# Patient Record
Sex: Female | Born: 1955 | Race: White | Hispanic: No | Marital: Single | Smoking: Current every day smoker
Health system: Southern US, Community
[De-identification: ages and names within clinical notes are randomized; demographics above are authoritative.]

## PROBLEM LIST (undated history)

## (undated) DIAGNOSIS — E7800 Pure hypercholesterolemia, unspecified: Secondary | ICD-10-CM

## (undated) DIAGNOSIS — K209 Esophagitis, unspecified without bleeding: Secondary | ICD-10-CM

## (undated) DIAGNOSIS — G8929 Other chronic pain: Secondary | ICD-10-CM

## (undated) DIAGNOSIS — R479 Unspecified speech disturbances: Secondary | ICD-10-CM

## (undated) DIAGNOSIS — G9389 Other specified disorders of brain: Secondary | ICD-10-CM

## (undated) DIAGNOSIS — M5430 Sciatica, unspecified side: Secondary | ICD-10-CM

## (undated) DIAGNOSIS — J449 Chronic obstructive pulmonary disease, unspecified: Secondary | ICD-10-CM

## (undated) DIAGNOSIS — F321 Major depressive disorder, single episode, moderate: Secondary | ICD-10-CM

## (undated) DIAGNOSIS — M549 Dorsalgia, unspecified: Secondary | ICD-10-CM

## (undated) DIAGNOSIS — R32 Unspecified urinary incontinence: Secondary | ICD-10-CM

## (undated) DIAGNOSIS — Z5189 Encounter for other specified aftercare: Secondary | ICD-10-CM

## (undated) DIAGNOSIS — M545 Low back pain, unspecified: Secondary | ICD-10-CM

## (undated) DIAGNOSIS — M542 Cervicalgia: Secondary | ICD-10-CM

## (undated) DIAGNOSIS — R270 Ataxia, unspecified: Secondary | ICD-10-CM

## (undated) DIAGNOSIS — I639 Cerebral infarction, unspecified: Secondary | ICD-10-CM

## (undated) DIAGNOSIS — F329 Major depressive disorder, single episode, unspecified: Secondary | ICD-10-CM

## (undated) DIAGNOSIS — F319 Bipolar disorder, unspecified: Secondary | ICD-10-CM

## (undated) DIAGNOSIS — I10 Essential (primary) hypertension: Secondary | ICD-10-CM

## (undated) DIAGNOSIS — F32A Depression, unspecified: Secondary | ICD-10-CM

## (undated) DIAGNOSIS — M5000 Cervical disc disorder with myelopathy, unspecified cervical region: Secondary | ICD-10-CM

## (undated) DIAGNOSIS — F410 Panic disorder [episodic paroxysmal anxiety] without agoraphobia: Secondary | ICD-10-CM

## (undated) DIAGNOSIS — K219 Gastro-esophageal reflux disease without esophagitis: Secondary | ICD-10-CM

## (undated) DIAGNOSIS — J45909 Unspecified asthma, uncomplicated: Secondary | ICD-10-CM

## (undated) HISTORY — PX: NECK SURGERY: SHX720

## (undated) HISTORY — PX: OOPHORECTOMY: SHX86

## (undated) HISTORY — DX: Encounter for other specified aftercare: Z51.89

## (undated) HISTORY — DX: Unspecified asthma, uncomplicated: J45.909

## (undated) HISTORY — DX: Low back pain, unspecified: M54.50

## (undated) HISTORY — PX: BACK SURGERY: SHX140

## (undated) HISTORY — DX: Bipolar disorder, unspecified: F31.9

## (undated) HISTORY — PX: SPINE SURGERY: SHX786

## (undated) HISTORY — DX: Cervical disc disorder with myelopathy, unspecified cervical region: M50.00

## (undated) HISTORY — DX: Unspecified urinary incontinence: R32

## (undated) HISTORY — DX: Ataxia, unspecified: R27.0

## (undated) HISTORY — DX: Major depressive disorder, single episode, moderate: F32.1

---
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
## 1976-08-07 HISTORY — PX: SPLENECTOMY, TOTAL: SHX788

## 1976-08-07 HISTORY — PX: NEPHRECTOMY: SHX65

## 2001-09-08 ENCOUNTER — Emergency Department (HOSPITAL_COMMUNITY): Admission: EM | Admit: 2001-09-08 | Discharge: 2001-09-08 | Payer: Self-pay | Admitting: Emergency Medicine

## 2002-03-18 ENCOUNTER — Ambulatory Visit (HOSPITAL_COMMUNITY): Admission: RE | Admit: 2002-03-18 | Discharge: 2002-03-18 | Payer: Self-pay | Admitting: Pulmonary Disease

## 2002-10-24 ENCOUNTER — Encounter: Payer: Self-pay | Admitting: Emergency Medicine

## 2002-10-24 ENCOUNTER — Emergency Department (HOSPITAL_COMMUNITY): Admission: EM | Admit: 2002-10-24 | Discharge: 2002-10-24 | Payer: Self-pay | Admitting: Emergency Medicine

## 2002-11-21 ENCOUNTER — Emergency Department (HOSPITAL_COMMUNITY): Admission: EM | Admit: 2002-11-21 | Discharge: 2002-11-21 | Payer: Self-pay | Admitting: *Deleted

## 2002-11-21 ENCOUNTER — Encounter: Payer: Self-pay | Admitting: *Deleted

## 2005-03-20 ENCOUNTER — Ambulatory Visit (HOSPITAL_COMMUNITY): Admission: RE | Admit: 2005-03-20 | Discharge: 2005-03-20 | Payer: Self-pay | Admitting: Pulmonary Disease

## 2005-06-05 ENCOUNTER — Emergency Department (HOSPITAL_COMMUNITY): Admission: EM | Admit: 2005-06-05 | Discharge: 2005-06-05 | Payer: Self-pay | Admitting: Emergency Medicine

## 2006-01-16 ENCOUNTER — Ambulatory Visit (HOSPITAL_COMMUNITY): Admission: RE | Admit: 2006-01-16 | Discharge: 2006-01-16 | Payer: Self-pay | Admitting: Pulmonary Disease

## 2006-04-25 ENCOUNTER — Encounter (HOSPITAL_COMMUNITY): Admission: RE | Admit: 2006-04-25 | Discharge: 2006-05-05 | Payer: Self-pay | Admitting: Neurosurgery

## 2006-05-09 ENCOUNTER — Encounter (HOSPITAL_COMMUNITY): Admission: RE | Admit: 2006-05-09 | Discharge: 2006-06-08 | Payer: Self-pay | Admitting: Neurosurgery

## 2006-06-13 ENCOUNTER — Encounter (HOSPITAL_COMMUNITY): Admission: RE | Admit: 2006-06-13 | Discharge: 2006-07-13 | Payer: Self-pay | Admitting: Neurosurgery

## 2006-08-27 ENCOUNTER — Encounter: Admission: RE | Admit: 2006-08-27 | Discharge: 2006-08-27 | Payer: Self-pay | Admitting: Pulmonary Disease

## 2006-12-04 ENCOUNTER — Ambulatory Visit (HOSPITAL_COMMUNITY): Admission: RE | Admit: 2006-12-04 | Discharge: 2006-12-04 | Payer: Self-pay | Admitting: Pulmonary Disease

## 2007-09-24 ENCOUNTER — Emergency Department (HOSPITAL_COMMUNITY): Admission: EM | Admit: 2007-09-24 | Discharge: 2007-09-24 | Payer: Self-pay | Admitting: Emergency Medicine

## 2007-09-27 ENCOUNTER — Ambulatory Visit (HOSPITAL_COMMUNITY): Admission: RE | Admit: 2007-09-27 | Discharge: 2007-09-27 | Payer: Self-pay | Admitting: Pulmonary Disease

## 2008-01-01 ENCOUNTER — Inpatient Hospital Stay (HOSPITAL_COMMUNITY): Admission: RE | Admit: 2008-01-01 | Discharge: 2008-01-04 | Payer: Self-pay | Admitting: Internal Medicine

## 2008-07-10 ENCOUNTER — Emergency Department (HOSPITAL_COMMUNITY): Admission: EM | Admit: 2008-07-10 | Discharge: 2008-07-10 | Payer: Self-pay | Admitting: Emergency Medicine

## 2008-08-12 ENCOUNTER — Ambulatory Visit: Payer: Self-pay | Admitting: Internal Medicine

## 2008-08-12 LAB — CONVERTED CEMR LAB
ALT: 14 units/L (ref 0–35)
AST: 19 units/L (ref 0–37)
Albumin: 4.4 g/dL (ref 3.5–5.2)
Alkaline Phosphatase: 86 units/L (ref 39–117)
Basophils Absolute: 0 10*3/uL (ref 0.0–0.1)
Basophils Relative: 0 % (ref 0–1)
Bilirubin, Direct: <0.1 mg/dL (ref 0.0–0.3)
Eosinophils Absolute: 0.1 10*3/uL (ref 0.0–0.7)
Eosinophils Relative: 1 % (ref 0–5)
HCT: 42.2 % (ref 36.0–46.0)
Hemoglobin: 14.2 g/dL (ref 12.0–15.0)
INR: 1 (ref 0.0–1.5)
Lymphocytes Relative: 48 % — ABNORMAL HIGH (ref 12–46)
Lymphs Abs: 5.3 10*3/uL — ABNORMAL HIGH (ref 0.7–4.0)
MCHC: 33.6 g/dL (ref 30.0–36.0)
MCV: 94.2 fL (ref 78.0–100.0)
Monocytes Absolute: 0.6 10*3/uL (ref 0.1–1.0)
Monocytes Relative: 5 % (ref 3–12)
Neutro Abs: 5.2 10*3/uL (ref 1.7–7.7)
Neutrophils Relative %: 46 % (ref 43–77)
Platelets: 294 10*3/uL (ref 150–400)
Prothrombin Time: 13.7 s (ref 11.6–15.2)
RBC: 4.48 M/uL (ref 3.87–5.11)
RDW: 14.4 % (ref 11.5–15.5)
Total Bilirubin: 0.3 mg/dL (ref 0.3–1.2)
Total Protein: 7.1 g/dL (ref 6.0–8.3)
WBC: 11.1 10*3/uL — ABNORMAL HIGH (ref 4.0–10.5)

## 2008-08-27 ENCOUNTER — Encounter: Payer: Self-pay | Admitting: Internal Medicine

## 2008-08-27 ENCOUNTER — Ambulatory Visit: Payer: Self-pay | Admitting: Internal Medicine

## 2008-08-27 ENCOUNTER — Ambulatory Visit (HOSPITAL_COMMUNITY): Admission: RE | Admit: 2008-08-27 | Discharge: 2008-08-27 | Payer: Self-pay | Admitting: Internal Medicine

## 2008-09-21 ENCOUNTER — Telehealth (INDEPENDENT_AMBULATORY_CARE_PROVIDER_SITE_OTHER): Payer: Self-pay

## 2008-09-29 ENCOUNTER — Telehealth (INDEPENDENT_AMBULATORY_CARE_PROVIDER_SITE_OTHER): Payer: Self-pay

## 2008-11-09 ENCOUNTER — Other Ambulatory Visit: Payer: Self-pay

## 2008-11-09 ENCOUNTER — Other Ambulatory Visit: Payer: Self-pay | Admitting: Emergency Medicine

## 2008-11-10 ENCOUNTER — Ambulatory Visit: Payer: Self-pay | Admitting: Psychiatry

## 2008-11-10 ENCOUNTER — Inpatient Hospital Stay (HOSPITAL_COMMUNITY): Admission: AD | Admit: 2008-11-10 | Discharge: 2008-11-12 | Payer: Self-pay | Admitting: Psychiatry

## 2009-01-15 ENCOUNTER — Ambulatory Visit (HOSPITAL_COMMUNITY): Admission: RE | Admit: 2009-01-15 | Discharge: 2009-01-15 | Payer: Self-pay | Admitting: Neurosurgery

## 2009-03-10 ENCOUNTER — Inpatient Hospital Stay (HOSPITAL_COMMUNITY): Admission: RE | Admit: 2009-03-10 | Discharge: 2009-03-11 | Payer: Self-pay | Admitting: Neurosurgery

## 2009-04-08 ENCOUNTER — Encounter (INDEPENDENT_AMBULATORY_CARE_PROVIDER_SITE_OTHER): Payer: Self-pay | Admitting: *Deleted

## 2009-12-01 ENCOUNTER — Ambulatory Visit (HOSPITAL_COMMUNITY): Admission: RE | Admit: 2009-12-01 | Discharge: 2009-12-01 | Payer: Self-pay | Admitting: Neurosurgery

## 2009-12-08 ENCOUNTER — Emergency Department (HOSPITAL_COMMUNITY): Admission: EM | Admit: 2009-12-08 | Discharge: 2009-12-08 | Payer: Self-pay | Admitting: Emergency Medicine

## 2010-01-12 ENCOUNTER — Encounter: Admission: RE | Admit: 2010-01-12 | Discharge: 2010-01-12 | Payer: Self-pay | Admitting: Neurosurgery

## 2010-01-20 ENCOUNTER — Emergency Department (HOSPITAL_COMMUNITY): Admission: EM | Admit: 2010-01-20 | Discharge: 2010-01-20 | Payer: Self-pay | Admitting: Emergency Medicine

## 2010-08-28 ENCOUNTER — Encounter: Payer: Self-pay | Admitting: Neurosurgery

## 2010-09-12 ENCOUNTER — Other Ambulatory Visit (HOSPITAL_COMMUNITY): Payer: Self-pay | Admitting: Neurosurgery

## 2010-09-12 DIAGNOSIS — M5416 Radiculopathy, lumbar region: Secondary | ICD-10-CM

## 2010-09-18 ENCOUNTER — Emergency Department (HOSPITAL_COMMUNITY)
Admission: EM | Admit: 2010-09-18 | Discharge: 2010-09-19 | Disposition: A | Discharge status: Home / Self Care | Payer: Medicaid Other | Attending: Emergency Medicine | Admitting: Emergency Medicine

## 2010-09-18 DIAGNOSIS — L02419 Cutaneous abscess of limb, unspecified: Secondary | ICD-10-CM | POA: Insufficient documentation

## 2010-09-18 DIAGNOSIS — L03119 Cellulitis of unspecified part of limb: Secondary | ICD-10-CM | POA: Insufficient documentation

## 2010-09-22 LAB — CULTURE, ROUTINE-ABSCESS: Culture: NO GROWTH

## 2010-09-27 ENCOUNTER — Ambulatory Visit (HOSPITAL_COMMUNITY)
Admission: RE | Admit: 2010-09-27 | Discharge: 2010-09-27 | Disposition: A | Discharge status: Home / Self Care | Payer: Medicaid Other | Source: Ambulatory Visit | Attending: Neurosurgery | Admitting: Neurosurgery

## 2010-09-27 ENCOUNTER — Other Ambulatory Visit (HOSPITAL_COMMUNITY): Payer: Self-pay

## 2010-09-27 ENCOUNTER — Other Ambulatory Visit (HOSPITAL_COMMUNITY): Payer: Self-pay | Admitting: Neurosurgery

## 2010-09-27 DIAGNOSIS — M5416 Radiculopathy, lumbar region: Secondary | ICD-10-CM

## 2010-09-27 DIAGNOSIS — M79609 Pain in unspecified limb: Secondary | ICD-10-CM | POA: Insufficient documentation

## 2010-09-27 DIAGNOSIS — M549 Dorsalgia, unspecified: Secondary | ICD-10-CM | POA: Insufficient documentation

## 2010-09-27 DIAGNOSIS — Z981 Arthrodesis status: Secondary | ICD-10-CM | POA: Insufficient documentation

## 2010-09-27 MED ORDER — IOHEXOL 180 MG/ML  SOLN: 20.0000 mL | Freq: Once | INTRAMUSCULAR | Status: DC | PRN

## 2010-10-23 LAB — CBC
Hemoglobin: 14.6 g/dL (ref 12.0–15.0)
RBC: 4.44 MIL/uL (ref 3.87–5.11)

## 2010-10-23 LAB — URINALYSIS, ROUTINE W REFLEX MICROSCOPIC
Bilirubin Urine: NEGATIVE
Specific Gravity, Urine: 1.025 (ref 1.005–1.030)
Urobilinogen, UA: 0.2 mg/dL (ref 0.0–1.0)
pH: 6 (ref 5.0–8.0)

## 2010-10-23 LAB — DIFFERENTIAL
Basophils Absolute: 0 10*3/uL (ref 0.0–0.1)
Lymphocytes Relative: 29 % (ref 12–46)
Monocytes Absolute: 0.8 10*3/uL (ref 0.1–1.0)
Monocytes Relative: 6 % (ref 3–12)
Neutro Abs: 8.4 10*3/uL — ABNORMAL HIGH (ref 1.7–7.7)

## 2010-10-23 LAB — BASIC METABOLIC PANEL
Calcium: 10.4 mg/dL (ref 8.4–10.5)
GFR calc Af Amer: >60 mL/min (ref >60)
GFR calc non Af Amer: >60 mL/min (ref >60)
Sodium: 141 mEq/L (ref 135–145)

## 2010-10-23 LAB — POCT CARDIAC MARKERS
CKMB, poc: 1.2 ng/mL (ref 1.0–8.0)
Troponin i, poc: <0.05 ng/mL (ref 0.00–0.09)

## 2010-10-23 LAB — URINE MICROSCOPIC-ADD ON

## 2010-10-25 LAB — CBC
HCT: 39.7 % (ref 36.0–46.0)
Platelets: 285 10*3/uL (ref 150–400)
WBC: 13.7 10*3/uL — ABNORMAL HIGH (ref 4.0–10.5)

## 2010-10-25 LAB — URINE MICROSCOPIC-ADD ON

## 2010-10-25 LAB — PROTIME-INR
INR: 0.91 (ref 0.00–1.49)
Prothrombin Time: 12.5 seconds (ref 11.6–15.2)

## 2010-10-25 LAB — URINALYSIS, ROUTINE W REFLEX MICROSCOPIC
Glucose, UA: NEGATIVE mg/dL
Ketones, ur: NEGATIVE mg/dL
Nitrite: NEGATIVE
Protein, ur: 30 mg/dL — AB
Urobilinogen, UA: 0.2 mg/dL (ref 0.0–1.0)

## 2010-10-25 LAB — BASIC METABOLIC PANEL
BUN: 20 mg/dL (ref 6–23)
Calcium: 9.5 mg/dL (ref 8.4–10.5)
GFR calc non Af Amer: >60 mL/min (ref >60)
Potassium: 4 mEq/L (ref 3.5–5.1)
Sodium: 140 mEq/L (ref 135–145)

## 2010-10-25 LAB — APTT: aPTT: 29 seconds (ref 24–37)

## 2010-10-25 LAB — DIFFERENTIAL
Eosinophils Relative: 1 % (ref 0–5)
Lymphocytes Relative: 33 % (ref 12–46)
Lymphs Abs: 4.5 10*3/uL — ABNORMAL HIGH (ref 0.7–4.0)
Neutro Abs: 8.3 10*3/uL — ABNORMAL HIGH (ref 1.7–7.7)

## 2010-11-13 LAB — CBC
MCHC: 34.9 g/dL (ref 30.0–36.0)
Platelets: 274 10*3/uL (ref 150–400)
RDW: 13.3 % (ref 11.5–15.5)

## 2010-11-13 LAB — BASIC METABOLIC PANEL
BUN: 13 mg/dL (ref 6–23)
CO2: 24 mEq/L (ref 19–32)
Calcium: 9.9 mg/dL (ref 8.4–10.5)
Creatinine, Ser: 0.69 mg/dL (ref 0.4–1.2)
GFR calc non Af Amer: >60 mL/min (ref >60)
Glucose, Bld: 85 mg/dL (ref 70–99)

## 2010-11-16 LAB — COMPREHENSIVE METABOLIC PANEL
AST: 22 U/L (ref 0–37)
Albumin: 4.1 g/dL (ref 3.5–5.2)
Alkaline Phosphatase: 72 U/L (ref 39–117)
BUN: 11 mg/dL (ref 6–23)
CO2: 25 mEq/L (ref 19–32)
Chloride: 106 mEq/L (ref 96–112)
Creatinine, Ser: 0.73 mg/dL (ref 0.4–1.2)
GFR calc Af Amer: >60 mL/min (ref >60)
GFR calc non Af Amer: >60 mL/min (ref >60)
Potassium: 4.8 mEq/L (ref 3.5–5.1)
Total Bilirubin: 0.7 mg/dL (ref 0.3–1.2)

## 2010-11-16 LAB — CBC
MCHC: 34.5 g/dL (ref 30.0–36.0)
MCV: 97.2 fL (ref 78.0–100.0)
Platelets: 265 10*3/uL (ref 150–400)
RBC: 4.05 MIL/uL (ref 3.87–5.11)

## 2010-11-16 LAB — RAPID URINE DRUG SCREEN, HOSP PERFORMED
Cocaine: NOT DETECTED
Opiates: POSITIVE — AB
Tetrahydrocannabinol: NOT DETECTED

## 2010-11-16 LAB — DIFFERENTIAL
Basophils Absolute: 0 10*3/uL (ref 0.0–0.1)
Basophils Relative: 0 % (ref 0–1)
Eosinophils Absolute: 0 10*3/uL (ref 0.0–0.7)
Neutro Abs: 5.4 10*3/uL (ref 1.7–7.7)
Neutrophils Relative %: 48 % (ref 43–77)

## 2010-11-21 LAB — BASIC METABOLIC PANEL
Calcium: 10.1 mg/dL (ref 8.4–10.5)
Creatinine, Ser: 0.77 mg/dL (ref 0.4–1.2)
GFR calc Af Amer: >60 mL/min (ref >60)
GFR calc non Af Amer: >60 mL/min (ref >60)
Sodium: 143 mEq/L (ref 135–145)

## 2010-11-21 LAB — HEMOGLOBIN AND HEMATOCRIT, BLOOD
HCT: 42.7 % (ref 36.0–46.0)
Hemoglobin: 14.4 g/dL (ref 12.0–15.0)

## 2010-11-21 LAB — HCG, QUANTITATIVE, PREGNANCY
hCG, Beta Chain, Quant, S: 4 m[IU]/mL (ref <5)
hCG, Beta Chain, Quant, S: 5 m[IU]/mL — ABNORMAL HIGH (ref <5)

## 2010-12-20 NOTE — Op Note (Signed)
Lindsey Howard, Lindsey Howard NO.:  [id number]   MEDICAL RECORD NO.:  [id number]          PATIENT TYPE:  INP   LOCATION:  3009                         FACILITY:  [facility]   PHYSICIAN:  Cristi Loron, M.D.DATE OF BIRTH:  1955/10/01   DATE OF PROCEDURE:  01/01/2008  DATE OF DISCHARGE:                               OPERATIVE REPORT   BRIEF HISTORY:  The patient is a 55 year old white female who has  suffered from severe back pain.  She failed medical management, worked  up with a lumbar MRI and lumbar x-rays which demonstrated that the  patient had a mobile spondylolisthesis at L4-5 with severe facet  arthropathy and degenerative changes.  I discussed the situation with  the patient and her husband and I discussed the various treatment  options with him.  The patient is aware of the risks, benefits, and  alternatives of surgery and decided to proceed with a L4-5 decompression  instrumentation and fusion.   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   PROCEDURE:  Bilateral L4 laminotomies to decompress bilateral L4-L5  nerve roots; L4-5 transforaminal lumbar interbody fusion with local  autograft bone and active fused/VITOSS bone graft extender; insertion of  L4-5 interbody prosthesis (Capstone PEEK interbody prosthesis);  posterior nonsegmental instrumentation L4-5 with legacy titanium pedicle  screws and rods; posterior arthrodesis L4-5 with local morselized  autograft bone, VITOSS and active fused graft extender.   SURGEON:  Cristi Loron, M.D.   ASSISTANT:  Clydene Fake, M.D.   ANESTHESIA:  General endotracheal.   ESTIMATED BLOOD LOSS:  300 mL.   SPECIMENS:  None.   DRAINS:  None.   COMPLICATIONS:  None.   DESCRIPTION OF PROCEDURE:  The patient was brought to  the operating room  by anesthesia team.  General endotracheal anesthesia was induced.  The  patient was turned to the prone position on the Wilson frame.  Her  lumbosacral region was then prepared with Betadine scrub and Betadine  solution.  Sterile drapes were applied.  I then injected the area to be  incised with Marcaine with epinephrine solution.  I used a scalpel to  make a linear midline incision over the L4-5 interspace.  I used  electrocautery to perform a bilateral subperiosteal dissection exposing  the spinous process lamina of L3, L4, and L5.  We obtained  intraoperative radiograph to confirm our location and then inserted  Versa-Trac retractor for exposure.   We began the decompression by performing a bilateral L4 laminotomy.  We  widened the laminotomy with the Kerrison punch and we removed the excess  ligamentum flavum as well as the medial aspect of the bilateral L4-5  facet joints.  Of note, this decompression was in excess of what was  required to do the lumbar interbody fusion secondary to the severe facet  arthropathy at this level.  We performed foraminotomy about the  bilateral L4 and L5 nerve roots completing the decompression of the  nerve roots and the thecal sac  at L4-5.   Having completed the decompression we now turned attention to  arthrodesis.  We used a high-speed drill to remove the inferior facet on  the right at L4-5 to provide a wide exposure to the lateral aspect of  the intervertebral disk.  We then incised the L4-5 intervertebral disk  and performed a partial intervertebral diskectomy with the pituitary  forceps and the curettes.  We prepared the vertebral endplates for the  lumbar interbody fusion by removing soft tissue using the curettes and  the pituitary forceps.  We then used a trial spacer and determined to  use a 12 x 26 mm Capstone PEEK interbody prosthesis.  We inserted the  prosthesis into the interspace of course after retracting neural   structures out of harm's way.  We then used the bone tap to turn the  prosthesis laterally, i.e. performed a transforaminal lumbar interbody  fusion.  I should note that we filled both anterior and posterior to the  prosthesis with a combination of VITOSS bone graft extender, local  autograft bone, and active fused bone graft extender.  This completed  the transforaminal lumbar interbody fusion and insertion of the  prosthesis.   We now turned our attention to instrumentation.  Under fluoroscopic  guidance, we cannulated the bilateral L4 and L5 pedicles with the bone  probe.  We tapped the pedicles with a 5.5-mm tap and then inserted 6.5 x  50-mm pedicle screws bilaterally into the L4 and L5 pedicles.  We did  this under fluoroscopic guidance.  I should note that prior to placement  of the pedicle screws, we probed inside the tapped pedicle to rule out  cortical breeches and after placement of pedicle screws, we palpated  along the medial aspect of the pedicles and around the cortical breeches  and there was no nerve root injury.  We then connected unilateral  pedicle screws with a lordotic rod.  We compressed the construct and  secured the rod in place using the capsule which we tightened  appropriately.  This completed the instrumentation.   We now turned our attention to posterolateral arthrodesis.  We used the  high-speed drill to decorticate the remainder of the left L4-5 facet  joint and a left L4 and L5 transverse processes.  We then laid a  combination of local autograft bone.  We obtained during the  decompression active fuse and VITOSS bone graft extender over these  decorticated posterolateral structures.  This completed the  posterolateral arthrodesis.  We then obtained hemostasis using bipolar  electrocautery.  We irrigated the wound out with bacitracin solution.  We then removed the retractor.  We then reapproximated the patient's  thoracolumbar fascia with interrupted #1  Vicryl suture, subcutaneous  tissue with interrupted 2-0 Vicryl suture and the skin with Steri-Strips  and Benzoin.  The wound was then coated with bacitracin ointment.  Sterile dressings were applied.  The drapes were removed and the patient  was subsequently returned to supine position where she was extubated by  anesthesia team and transported to the post anesthesia care unit in  stable condition.  All sponge, instrument, and needle counts were  correct at the end of this case.      Cristi Loron, M.D.  Electronically Signed     JDJ/MEDQ  D:  01/01/2008  T:  01/02/2008  Job:  [id number]
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
## 2010-12-20 NOTE — Op Note (Signed)
Lindsey Howard, Lindsey Howard NO.:  [id number]   MEDICAL RECORD NO.:  [id number]          PATIENT TYPE:  OIB   LOCATION:  3534                         FACILITY:  [facility]   PHYSICIAN:  Cristi Loron, M.D.DATE OF BIRTH:  Jul 11, 1956   DATE OF PROCEDURE:  03/10/2009  DATE OF DISCHARGE:                               OPERATIVE REPORT   BRIEF HISTORY:  This patient is a 55 year old white female who has  suffered from neck and bilateral arm pain consistent with a cervical  radiculopathy.  She had failed medical management and was worked up with  a cervical MRI, which demonstrated the patient had disk degeneration  with spondylosis, etc, at C4-5, C5-6.  I discussed the various treatment  options with the patient and husband including surgery.  She has weighed  the risks, benefits and alternatives of surgery and decided to proceed  with a C4-5 and C5-6 anterior cervical diskectomy and fusion plating.   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   PROCEDURES:  C4-5 and C5-6 extensive anterior cervical  diskectomy/decompression; C4-5 and C5-6 anterior interbody arthrodesis  with local morselized autograft bone and Vitoss bone graft extender;  insertion of C4-5 and C5-6 interbody prosthesis (Novel PEEK interbody  prosthesis); and C4 through C6 anterior cervical plating with Codman  Slim-Loc titanium plate and screws.   SURGEON:  Cristi Loron, MD   ASSISTANT:  Coletta Memos, MD   ANESTHESIA:  General endotracheal.   ESTIMATED BLOOD LOSS:  100 mL.   SPECIMENS:  None.   DRAINS:  None.   COMPLICATIONS:  None.   DESCRIPTION OF PROCEDURE:  The patient was brought to the operating room  by the anesthesia team.  General endotracheal anesthesia was induced.  This  patient remained in supine position.  A roll was placed under her  shoulders and placed her neck in slight extension.  Her anterior  cervical region was then prepared with Betadine scrub and Betadine  solution.  Sterile drapes were applied.  I then injected the area to be  incised with Marcaine with epinephrine solution and a scalpel to make a  transverse incision in the patient's left anterior neck.  I used the  Metzenbaum scissors to divide the platysma muscle and then to dissect  medial to sternocleidomastoid muscle, jugular vein, and carotid artery.  We carefully dissected down towards the anterior cervical spine  identifying the esophagus and retracted it medially.  We then used the  Kittner swabs to clear the soft tissue from the anterior cervical spine  and then inserted a bent spinal needle into the upper exposed  intervertebral disk space.  We obtained intraoperative radiograph to  confirm our location.   We then used electrocautery to detach the medial border of the longus  colli muscle bilaterally from the C4-5 and C5-6 intervertebral disk  space.  We inserted the Caspar self-retaining retractor underneath the  longus colli muscle bilaterally to provide exposure.  We began the  decompression by incising the C4-5 intervertebral disk with a #15 blade  scalpel.  We performed a partial intervertebral diskectomy using the  pituitary forceps and the Karlin curettes.  We inserted distraction  screws at C4 and C5 and distracted the interspace and then used the high-  speed drill to decorticate the vertebral endplates at C4-5 to drill away  the remainder of C4-5 intervertebral disk and to drill away some  posterior spondylosis, and to thin out the posterior longitudinal  ligament.  We incised the ligament with arachnoid knife and then removed  it with Kerrison punch undercutting the vertebral endplates at C4-5  decompressing the thecal sac.  We then performed foraminotomies about  the  bilateral C5 nerve roots completing the decompression at C4-5.   We then repeated this procedure in an analogous fashion at C5-6  decompressing the thecal sac and the bilateral C6 nerve roots.  This  completed the decompression.   We now turned our attention to the arthrodesis.  We used the trial  spacers and determined to use a 5-mm medium interbody prosthesis at both  the levels.  We prefilled the prostheses with a combination of local  morselized autograft bone that we obtained during the decompression as  well as Actifuse bone graft extender.  We then inserted the prostheses  and distracted interspaces at C4-5 and C5-6 and then removed distraction  screws.  There was good snug fit of the prostheses at both levels.  This  completed the interbody fusion.   We now turned attention to the anterior spinal instrumentation.  We used  a high-speed drill to remove some ventral spondylosis from the vertebral  endplates at C4-5 and C5-6 so that the plate will lay down flat.  We  selected appropriate length Codman Slim-Loc anterior cervical plate.  We  laid it along the anterior aspect of the vertebral bodies of the C4, C6.  We then drilled two 12-mm holes at C4, 5, and 6.  We then secured the  plate to the vertebral bodies by placing two 12-mm self-tapping screws  at C4, two at C5, and two at C6.  We got good bony purchase.  We then  obtained intraoperative radiograph, which demonstrated good position of  plate, screws, and interbody prostheses.  We, therefore, secured the  screws to the plate by locking each cam.  This completed the  instrumentation.   We then obtained hemostasis using bipolar electrocautery.  We irrigated  the wound out with bacitracin solution.  We then removed the retractors.  We inspected the esophagus for any damage; there was none apparent.  We  then reapproximated the patient's platysma muscle with interrupted 3-0  Vicryl suture.  The subcutaneous tissue with  interrupted 3-0 Vicryl  suture and the skin with Dermabond, as the patient had an allergy to  ADHESIVES.  The drapes were then removed, and the patient was  subsequently extubated by the anesthesia team and transported to the  postanesthesia care unit in stable condition.  All sponge, instrument,  and needle counts were correct at the end of this case.      Cristi Loron, M.D.  Electronically Signed     JDJ/MEDQ  D:  03/10/2009  T:  03/11/2009  Job:  [id number]

## 2010-12-20 NOTE — Op Note (Signed)
[name], WEIGHT               ACCOUNT NO.:  [id number]   MEDICAL RECORD NO.:  [id number]          PATIENT TYPE:  AMB   LOCATION:  DAY                           FACILITY:  [facility]   PHYSICIAN:  R. Roetta Sessions, M.D. DATE OF BIRTH:  12-26-1955   DATE OF PROCEDURE:  08/27/2008  DATE OF DISCHARGE:                               OPERATIVE REPORT   PROCEDURE:  EGD with Lindsey Howard dilation followed by gastric mucosal biopsy  followed by screening iliac colonoscopy.   INDICATIONS FOR PROCEDURE:  [name] with intermittent  hematemesis, some atypical chest pain, progressive esophageal dysphagia  to solids for the past 9 months, longstanding smoking history.  She also  describes melena from time to time.  She has never had her upper GI  tract evaluated nor she had screening colonoscopy.  Because of her  polypharmacy, we will enlist Dr. Jayme Cloud for anesthesia as EGD with  possible esophageal dilation and colonoscopy now being performed.  Risks, benefits, alternatives, and limitations have been reviewed,  questions were answered.  Please see documentation in the medical  record.   PROCEDURE NOTE:  O2 saturation, blood pressure, pulse, respirations were  monitored throughout the entire procedure.   Cetacaine spray for topical pharyngeal anesthesia.  Propofol anesthesia  per Anesthesia.   INSTRUMENT:  Pentax video chip system.   FINDINGS:  EGD examination:  Tubular esophagus revealed patent tubular  esophagus.  Tiny circumferential distal esophageal erosions and no  Barrett esophagus.  No evidence of tumor.  EG junction easily traversed.  Stomach:  Gastric cavity was emptied and insufflated well with air.  Thorough examination of the gastric mucosa including retroflexed view of  the proximal stomach, esophagogastric junction demonstrated diffuse  mottling of the gastric mucosa and some submucosal petechiae, but there  was no evidence of ulcer or infiltrating process.  There was no  hiatal  hernia.  The pylorus was somewhat patulous, very easily traversed.  Examination of the bulb and second portion revealed no abnormalities.  Therapeutic/diagnostic maneuvers performed.  The Scope was withdrawn.  A  54-French Maloney dilator was passed with full insertion with ease and  that revealed no apparent complication related to the passage of the  dilator.  Subsequently, biopsies of the antrum and body were taken for  histologic study.  The patient tolerated the procedure well.   Colonoscopy:  Digital rectal exam revealed no abnormalities.   Endoscopic findings:  Prep was good except for the ascending segment  that was relatively poor.   Colon:  Colonic mucosa was surveyed from the rectosigmoid junction  through the left transverse, right colon to the appendiceal orifice,  ileocecal valve, and cecum.  These structures were well seen and  photographed for the record.  Terminal ileum was intubated to 10 cm.  From this level, scope was slowly and cautiously withdrawn.  All  previously mentioned mucosal surfaces were again seen.  There was  adherent stool throughout the right colon, which had to be washed to  gain adequate visualization.  The colonic mucosa was opened, well seen,  and appeared normal.  Terminal ileal mucosa  also appeared normal.  Scope  was pulled down to the rectum with the examination of rectal mucosa  including retroflex view of the anal verge demonstrated no  abnormalities.  The patient tolerated the procedure well.  (Cecal  withdrawal time was 6 minutes 30 seconds).   IMPRESSION:  1. Circumferential distal esophageal erosions consistent with mild      erosive reflux esophagitis, otherwise normal esophageal mucosa      status post pass of Maloney dilator.  2. Mottling and fine petechial hemorrhage of the gastric mucosa status      post biopsy.  3. Patent, patulous pylorus.  Normal D1 and D2.   RECOMMENDATIONS:  1. Begin Zegerid 40 mg orally daily.   Prescription given.  Antireflux      measures literature provided to Lindsey Howard.  2. A repeat colonoscopy for screening purposes in 10 years.  3. Follow up on path.  4. Further recommendations to follow.      Jonathon Bellows, M.D.  Electronically Signed     RMR/MEDQ  D:  08/27/2008  T:  08/27/2008  Job:  [id number]   cc:   Ramon Dredge L. Juanetta Gosling, M.D.  Fax: [phone]
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
## 2011-05-03 LAB — BASIC METABOLIC PANEL
BUN: 11
BUN: 8
BUN: 8
CO2: 27
Calcium: 9.9
Chloride: 102
GFR calc non Af Amer: >60
GFR calc non Af Amer: >60
Glucose, Bld: 136 — ABNORMAL HIGH
Glucose, Bld: 82
Potassium: 4.3
Potassium: 4.4
Sodium: 142

## 2011-05-03 LAB — DIFFERENTIAL
Basophils Absolute: 0
Basophils Absolute: 0.1
Basophils Relative: 0
Basophils Relative: 0
Eosinophils Absolute: 0
Eosinophils Absolute: 0
Lymphs Abs: 2
Monocytes Absolute: 1.7 — ABNORMAL HIGH
Monocytes Relative: 9
Neutro Abs: 15.2 — ABNORMAL HIGH
Neutro Abs: 23.4 — ABNORMAL HIGH
Neutrophils Relative %: 79 — ABNORMAL HIGH

## 2011-05-03 LAB — URINALYSIS, ROUTINE W REFLEX MICROSCOPIC
Bilirubin Urine: NEGATIVE
Bilirubin Urine: NEGATIVE
Ketones, ur: NEGATIVE
Nitrite: NEGATIVE
Protein, ur: 30 — AB
Specific Gravity, Urine: 1.015
Urobilinogen, UA: 0.2
pH: 7.5

## 2011-05-03 LAB — CBC
HCT: 34.2 — ABNORMAL LOW
HCT: 40.3
Hemoglobin: 10.1 — ABNORMAL LOW
Hemoglobin: 11.9 — ABNORMAL LOW
Hemoglobin: 14.1
MCHC: 34.6
MCV: 93.8
MCV: 94
Platelets: 279
Platelets: 304
Platelets: ADEQUATE
RDW: 13.5
RDW: 13.6
RDW: 13.7
WBC: 12.9 — ABNORMAL HIGH

## 2011-05-03 LAB — URINE MICROSCOPIC-ADD ON

## 2011-05-03 LAB — TYPE AND SCREEN: Antibody Screen: NEGATIVE

## 2011-05-11 LAB — CBC
HCT: 40.8 % (ref 36.0–46.0)
Hemoglobin: 13.8 g/dL (ref 12.0–15.0)
MCHC: 33.9 g/dL (ref 30.0–36.0)
MCV: 93.9 fL (ref 78.0–100.0)
Platelets: 324 10*3/uL (ref 150–400)
RDW: 14.4 % (ref 11.5–15.5)

## 2011-05-11 LAB — URINALYSIS, ROUTINE W REFLEX MICROSCOPIC
Bilirubin Urine: NEGATIVE
Nitrite: NEGATIVE
Protein, ur: 100 mg/dL — AB
Specific Gravity, Urine: 1.02 (ref 1.005–1.030)
Urobilinogen, UA: 0.2 mg/dL (ref 0.0–1.0)

## 2011-05-11 LAB — URINE MICROSCOPIC-ADD ON

## 2011-05-11 LAB — DIFFERENTIAL
Basophils Relative: 1 % (ref 0–1)
Lymphocytes Relative: 25 % (ref 12–46)
Monocytes Absolute: 0.6 10*3/uL (ref 0.1–1.0)
Monocytes Relative: 5 % (ref 3–12)
Neutro Abs: 8.5 10*3/uL — ABNORMAL HIGH (ref 1.7–7.7)
Neutrophils Relative %: 69 % (ref 43–77)

## 2011-05-11 LAB — COMPREHENSIVE METABOLIC PANEL
Albumin: 3.4 g/dL — ABNORMAL LOW (ref 3.5–5.2)
Alkaline Phosphatase: 83 U/L (ref 39–117)
BUN: 9 mg/dL (ref 6–23)
Calcium: 9.7 mg/dL (ref 8.4–10.5)
Creatinine, Ser: 0.64 mg/dL (ref 0.4–1.2)
Glucose, Bld: 154 mg/dL — ABNORMAL HIGH (ref 70–99)
Total Protein: 6.5 g/dL (ref 6.0–8.3)

## 2011-08-10 ENCOUNTER — Emergency Department (HOSPITAL_COMMUNITY)
Admission: EM | Admit: 2011-08-10 | Discharge: 2011-08-11 | Disposition: A | Discharge status: Home / Self Care | Payer: Medicaid Other | Attending: Emergency Medicine | Admitting: Emergency Medicine

## 2011-08-10 ENCOUNTER — Emergency Department (HOSPITAL_COMMUNITY): Payer: Medicaid Other

## 2011-08-10 ENCOUNTER — Other Ambulatory Visit: Payer: Self-pay

## 2011-08-10 DIAGNOSIS — F17200 Nicotine dependence, unspecified, uncomplicated: Secondary | ICD-10-CM | POA: Insufficient documentation

## 2011-08-10 DIAGNOSIS — R079 Chest pain, unspecified: Secondary | ICD-10-CM | POA: Insufficient documentation

## 2011-08-10 DIAGNOSIS — F411 Generalized anxiety disorder: Secondary | ICD-10-CM | POA: Insufficient documentation

## 2011-08-10 DIAGNOSIS — R05 Cough: Secondary | ICD-10-CM | POA: Insufficient documentation

## 2011-08-10 DIAGNOSIS — R059 Cough, unspecified: Secondary | ICD-10-CM | POA: Insufficient documentation

## 2011-08-10 DIAGNOSIS — F410 Panic disorder [episodic paroxysmal anxiety] without agoraphobia: Secondary | ICD-10-CM | POA: Insufficient documentation

## 2011-08-10 DIAGNOSIS — R0602 Shortness of breath: Secondary | ICD-10-CM | POA: Insufficient documentation

## 2011-08-10 HISTORY — DX: Major depressive disorder, single episode, unspecified: F32.9

## 2011-08-10 HISTORY — DX: Depression, unspecified: F32.A

## 2011-08-10 HISTORY — DX: Sciatica, unspecified side: M54.30

## 2011-08-10 HISTORY — DX: Panic disorder (episodic paroxysmal anxiety): F41.0

## 2011-08-10 LAB — CBC
Platelets: 304 10*3/uL (ref 150–400)
RBC: 4.34 MIL/uL (ref 3.87–5.11)
WBC: 14.8 10*3/uL — ABNORMAL HIGH (ref 4.0–10.5)

## 2011-08-10 LAB — DIFFERENTIAL
Basophils Absolute: 0 10*3/uL (ref 0.0–0.1)
Eosinophils Absolute: 0.1 10*3/uL (ref 0.0–0.7)
Lymphocytes Relative: 26 % (ref 12–46)
Lymphs Abs: 3.9 10*3/uL (ref 0.7–4.0)
Neutrophils Relative %: 63 % (ref 43–77)

## 2011-08-10 LAB — POCT I-STAT TROPONIN I: Troponin i, poc: 0 ng/mL (ref 0.00–0.08)

## 2011-08-10 LAB — BASIC METABOLIC PANEL
CO2: 28 mEq/L (ref 19–32)
GFR calc non Af Amer: >90 mL/min (ref >90)
Glucose, Bld: 94 mg/dL (ref 70–99)
Potassium: 3.7 mEq/L (ref 3.5–5.1)
Sodium: 139 mEq/L (ref 135–145)

## 2011-08-10 MED ORDER — MORPHINE SULFATE 4 MG/ML IJ SOLN
4.0000 mg | Freq: Once | INTRAMUSCULAR | Status: AC
  Administered 2011-08-10: 4 mg via INTRAVENOUS
  Filled 2011-08-10: qty 1

## 2011-08-10 NOTE — ED Provider Notes (Signed)
History     CSN: [id number]  Arrival date & time 08/10/11  1500   First MD Initiated Contact with Patient 08/10/11 1723      Chief Complaint  Patient presents with  . Chest Pain     Patient is a 56 y.o. female presenting with chest pain. The history is provided by the patient and a relative.  Chest Pain The chest pain began 12 - 24 hours ago. Episode Length: a few seconds. Chest pain occurs frequently. The chest pain is unchanged. The pain is associated with breathing, coughing and lifting. The severity of the pain is moderate. The quality of the pain is described as sharp. The pain does not radiate. Chest pain is worsened by certain positions and deep breathing. Primary symptoms include shortness of breath and cough. Pertinent negatives for primary symptoms include no fever, no fatigue, no syncope, no abdominal pain, no nausea, no vomiting and no dizziness.  Pertinent negatives for associated symptoms include no near-syncope and no numbness.   Pt woke up this AM with left sided CP Worse with breathing/palpation/movement Did fall several days ago but can not recall a chest injury Pain is sharp in nature No abd pain No focal weakness No h/o CAD/DVT/PE Pain has not been constant  Past Medical History  Diagnosis Date  . Back pain   . Sciatica   . Panic attacks   . Depression     Past Surgical History  Procedure Date  . Back surgery   . Neck surgery     No family history on file.  History  Substance Use Topics  . Smoking status: Current Everyday Smoker  . Smokeless tobacco: Not on file  . Alcohol Use: No    OB History    Grav Para Term Preterm Abortions TAB SAB Ect Mult Living                  Review of Systems  Constitutional: Negative for fever and fatigue.  Respiratory: Positive for cough and shortness of breath.   Cardiovascular: Positive for chest pain. Negative for syncope and near-syncope.  Gastrointestinal: Negative for nausea, vomiting and abdominal  pain.  Neurological: Negative for dizziness and numbness.  All other systems reviewed and are negative.    Allergies  Latex and Penicillins  Home Medications   Current Outpatient Rx  Name Route Sig Dispense Refill  . BC HEADACHE POWDER PO Oral Take 1 packet by mouth as needed. For pain     . DIAZEPAM 5 MG PO TABS Oral Take 5 mg by mouth every 8 (eight) hours as needed. For spasms     . HYDROCODONE-ACETAMINOPHEN 10-325 MG PO TABS Oral Take 1 tablet by mouth every 5 (five) hours as needed. For pain     . SERTRALINE HCL 50 MG PO TABS Oral Take 50 mg by mouth daily.        BP 112/76  Pulse 78  Temp(Src) 97.8 F (36.6 C) (Oral)  Resp 20  Ht 5\' 3"  (1.6 m)  Wt 135 lb (61.236 kg)  BMI 23.91 kg/m2  SpO2 95%  Physical Exam CONSTITUTIONAL: Well developed/well nourished HEAD AND FACE: Normocephalic/atraumatic EYES: EOMI/PERRL ENMT: Mucous membranes moist NECK: supple no meningeal signs SPINE:entire spine nontender CV: S1/S2 noted, no murmurs/rubs/gallops noted LUNGS: Lungs are clear to auscultation bilaterally, no apparent distress Chest - tender to palpation in left chest.  No bruising/crepitance.  Worse with movement of her torso ABDOMEN: soft, nontender, no rebound or guarding GU:no cva tenderness NEURO:  Pt is awake/alert, moves all extremitiesx4 EXTREMITIES: pulses normal, full ROM SKIN: warm, color normal PSYCH: no abnormalities of mood noted   ED Course  Procedures   Labs Reviewed  CBC - Abnormal; Notable for the following:    WBC 14.8 (*)    All other components within normal limits  DIFFERENTIAL - Abnormal; Notable for the following:    Neutro Abs 9.3 (*)    Monocytes Absolute 1.5 (*)    All other components within normal limits  POCT I-STAT TROPONIN I  I-STAT TROPONIN I  BASIC METABOLIC PANEL   Dg Chest 2 View  08/10/2011  *RADIOLOGY REPORT*  Clinical Data: Cough.  Chest pain.  CHEST - 2 VIEW  Comparison: 01/20/2010  Findings: Mild pulmonary interstitial  prominence remains stable. No evidence of acute infiltrate or edema.  No evidence of pleural effusion.  Heart size and mediastinal contours are within normal limits.  No mass or lymphadenopathy identified.  Previous cervical spine fusion hardware again noted.  IMPRESSION: Stable exam.  No active disease.  Original Report Authenticated By: Danae Orleans, M.D.    5:58 PM Suspicion for ACS/PE/Dissection is low given history, appears to be musculoskeletal in nature Cardiac markers sent per protocol, not used in decision making   MDM  Nursing notes reviewed and considered in documentation xrays reviewed and considered    Date: 08/10/2011  Rate: 82  Rhythm: normal sinus rhythm  QRS Axis: normal  Intervals: normal  ST/T Wave abnormalities: nonspecific ST changes  Conduction Disutrbances:none  Narrative Interpretation:   Old EKG Reviewed: unchanged          Joya Gaskins, MD 08/10/11 [phone]

## 2011-08-10 NOTE — ED Notes (Signed)
Pt c/o left sided cp worse with inspiration.

## 2011-08-10 NOTE — ED Notes (Signed)
Left in c/o spouse for transport home; a&ox4; in no distress; instructions reviewed-verbalizes understanding.

## 2011-08-10 NOTE — ED Notes (Signed)
C/o left upper chest pain onset 0400 this AM; reports pain feels like an ache and is constant; states pain is worse with palpation and cough; c/o SOB with pain; denies n/v; skin warm and dry; placed on cardiac monitor-NSR rate 78 w/o ectopy.

## 2012-02-11 ENCOUNTER — Emergency Department (HOSPITAL_COMMUNITY)
Admission: EM | Admit: 2012-02-11 | Discharge: 2012-02-11 | Disposition: A | Discharge status: Home / Self Care | Payer: Medicaid Other | Attending: Emergency Medicine | Admitting: Emergency Medicine

## 2012-02-11 ENCOUNTER — Encounter (HOSPITAL_COMMUNITY): Payer: Self-pay

## 2012-02-11 ENCOUNTER — Emergency Department (HOSPITAL_COMMUNITY): Payer: Medicaid Other

## 2012-02-11 DIAGNOSIS — F3289 Other specified depressive episodes: Secondary | ICD-10-CM | POA: Insufficient documentation

## 2012-02-11 DIAGNOSIS — C44319 Basal cell carcinoma of skin of other parts of face: Secondary | ICD-10-CM

## 2012-02-11 DIAGNOSIS — Z79899 Other long term (current) drug therapy: Secondary | ICD-10-CM | POA: Insufficient documentation

## 2012-02-11 DIAGNOSIS — R112 Nausea with vomiting, unspecified: Secondary | ICD-10-CM | POA: Insufficient documentation

## 2012-02-11 DIAGNOSIS — Z9181 History of falling: Secondary | ICD-10-CM | POA: Insufficient documentation

## 2012-02-11 DIAGNOSIS — F329 Major depressive disorder, single episode, unspecified: Secondary | ICD-10-CM | POA: Insufficient documentation

## 2012-02-11 DIAGNOSIS — R51 Headache: Secondary | ICD-10-CM | POA: Insufficient documentation

## 2012-02-11 MED ORDER — SODIUM CHLORIDE 0.9 % IV SOLN
Freq: Once | INTRAVENOUS | Status: AC
  Administered 2012-02-11: 12:00:00 via INTRAVENOUS

## 2012-02-11 MED ORDER — DEXAMETHASONE SODIUM PHOSPHATE 4 MG/ML IJ SOLN
10.0000 mg | Freq: Once | INTRAMUSCULAR | Status: AC
  Administered 2012-02-11: 10 mg via INTRAVENOUS
  Filled 2012-02-11: qty 3

## 2012-02-11 MED ORDER — DIPHENHYDRAMINE HCL 50 MG/ML IJ SOLN
25.0000 mg | Freq: Once | INTRAMUSCULAR | Status: AC
  Administered 2012-02-11: 25 mg via INTRAVENOUS
  Filled 2012-02-11: qty 1

## 2012-02-11 MED ORDER — METOCLOPRAMIDE HCL 5 MG/ML IJ SOLN
10.0000 mg | Freq: Once | INTRAMUSCULAR | Status: AC
  Administered 2012-02-11: 10 mg via INTRAVENOUS
  Filled 2012-02-11: qty 2

## 2012-02-11 NOTE — ED Notes (Signed)
Pt complain of headache. Boyfriend thinks she may fallen, but pt was found in bed
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
## 2012-06-28 ENCOUNTER — Emergency Department (HOSPITAL_COMMUNITY): Payer: Medicaid Other

## 2012-06-28 ENCOUNTER — Observation Stay (HOSPITAL_COMMUNITY)
Admission: EM | Admit: 2012-06-28 | Discharge: 2012-06-29 | DRG: 948 | Disposition: A | Discharge status: Home / Self Care | Payer: Medicaid Other | Attending: Internal Medicine | Admitting: Internal Medicine

## 2012-06-28 ENCOUNTER — Encounter (HOSPITAL_COMMUNITY): Payer: Self-pay | Admitting: *Deleted

## 2012-06-28 DIAGNOSIS — M5030 Other cervical disc degeneration, unspecified cervical region: Secondary | ICD-10-CM | POA: Diagnosis present

## 2012-06-28 DIAGNOSIS — M5137 Other intervertebral disc degeneration, lumbosacral region: Secondary | ICD-10-CM | POA: Diagnosis present

## 2012-06-28 DIAGNOSIS — Z9089 Acquired absence of other organs: Secondary | ICD-10-CM

## 2012-06-28 DIAGNOSIS — R29898 Other symptoms and signs involving the musculoskeletal system: Secondary | ICD-10-CM

## 2012-06-28 DIAGNOSIS — G459 Transient cerebral ischemic attack, unspecified: Secondary | ICD-10-CM

## 2012-06-28 DIAGNOSIS — F410 Panic disorder [episodic paroxysmal anxiety] without agoraphobia: Secondary | ICD-10-CM | POA: Diagnosis present

## 2012-06-28 DIAGNOSIS — M51379 Other intervertebral disc degeneration, lumbosacral region without mention of lumbar back pain or lower extremity pain: Secondary | ICD-10-CM | POA: Diagnosis present

## 2012-06-28 DIAGNOSIS — G9389 Other specified disorders of brain: Secondary | ICD-10-CM

## 2012-06-28 DIAGNOSIS — R4182 Altered mental status, unspecified: Principal | ICD-10-CM | POA: Diagnosis present

## 2012-06-28 DIAGNOSIS — F329 Major depressive disorder, single episode, unspecified: Secondary | ICD-10-CM | POA: Diagnosis present

## 2012-06-28 DIAGNOSIS — F3289 Other specified depressive episodes: Secondary | ICD-10-CM | POA: Diagnosis present

## 2012-06-28 DIAGNOSIS — Z79899 Other long term (current) drug therapy: Secondary | ICD-10-CM

## 2012-06-28 DIAGNOSIS — F32A Depression, unspecified: Secondary | ICD-10-CM

## 2012-06-28 DIAGNOSIS — F17200 Nicotine dependence, unspecified, uncomplicated: Secondary | ICD-10-CM | POA: Diagnosis present

## 2012-06-28 DIAGNOSIS — F411 Generalized anxiety disorder: Secondary | ICD-10-CM | POA: Diagnosis present

## 2012-06-28 DIAGNOSIS — Z905 Acquired absence of kidney: Secondary | ICD-10-CM

## 2012-06-28 HISTORY — DX: Cerebral infarction, unspecified: I63.9

## 2012-06-28 LAB — COMPREHENSIVE METABOLIC PANEL
ALT: 11 U/L (ref 0–35)
AST: 18 U/L (ref 0–37)
Albumin: 3.9 g/dL (ref 3.5–5.2)
Alkaline Phosphatase: 82 U/L (ref 39–117)
CO2: 28 mEq/L (ref 19–32)
Chloride: 101 mEq/L (ref 96–112)
Creatinine, Ser: 0.52 mg/dL (ref 0.50–1.10)
GFR calc non Af Amer: >90 mL/min (ref >90)
Potassium: 3.5 mEq/L (ref 3.5–5.1)
Sodium: 141 mEq/L (ref 135–145)
Total Bilirubin: 0.2 mg/dL — ABNORMAL LOW (ref 0.3–1.2)

## 2012-06-28 LAB — CBC WITH DIFFERENTIAL/PLATELET
Basophils Absolute: 0 10*3/uL (ref 0.0–0.1)
HCT: 45.4 % (ref 36.0–46.0)
Lymphocytes Relative: 48 % — ABNORMAL HIGH (ref 12–46)
Monocytes Absolute: 0.8 10*3/uL (ref 0.1–1.0)
Neutro Abs: 4.8 10*3/uL (ref 1.7–7.7)
Neutrophils Relative %: 44 % (ref 43–77)
RDW: 14.7 % (ref 11.5–15.5)
WBC: 10.9 10*3/uL — ABNORMAL HIGH (ref 4.0–10.5)

## 2012-06-28 LAB — RAPID URINE DRUG SCREEN, HOSP PERFORMED
Amphetamines: NOT DETECTED
Benzodiazepines: NOT DETECTED
Tetrahydrocannabinol: NOT DETECTED

## 2012-06-28 LAB — URINALYSIS, ROUTINE W REFLEX MICROSCOPIC
Bilirubin Urine: NEGATIVE
Glucose, UA: NEGATIVE mg/dL
Ketones, ur: NEGATIVE mg/dL
Protein, ur: 100 mg/dL — AB
pH: 6 (ref 5.0–8.0)

## 2012-06-28 LAB — URINE MICROSCOPIC-ADD ON

## 2012-06-28 MED ORDER — ONDANSETRON HCL 4 MG/2ML IJ SOLN
4.0000 mg | Freq: Once | INTRAMUSCULAR | Status: AC
  Administered 2012-06-28: 4 mg via INTRAVENOUS
  Filled 2012-06-28: qty 2

## 2012-06-28 MED ORDER — HYDROCODONE-ACETAMINOPHEN 10-325 MG PO TABS: 1.0000 | ORAL_TABLET | Freq: Four times a day (QID) | ORAL | Status: DC | PRN

## 2012-06-28 MED ORDER — SODIUM CHLORIDE 0.9 % IJ SOLN
3.0000 mL | Freq: Two times a day (BID) | INTRAMUSCULAR | Status: DC
  Administered 2012-06-28 – 2012-06-29 (×2): 3 mL via INTRAVENOUS
  Filled 2012-06-28: qty 3

## 2012-06-28 MED ORDER — ACETAMINOPHEN 650 MG RE SUPP: 650.0000 mg | Freq: Four times a day (QID) | RECTAL | Status: DC | PRN

## 2012-06-28 MED ORDER — ONDANSETRON HCL 4 MG/2ML IJ SOLN: 4.0000 mg | Freq: Four times a day (QID) | INTRAMUSCULAR | Status: DC | PRN

## 2012-06-28 MED ORDER — ENOXAPARIN SODIUM 40 MG/0.4ML ~~LOC~~ SOLN
40.0000 mg | SUBCUTANEOUS | Status: DC
  Administered 2012-06-28: 40 mg via SUBCUTANEOUS
  Filled 2012-06-28: qty 0.4

## 2012-06-28 MED ORDER — ALUM & MAG HYDROXIDE-SIMETH 200-200-20 MG/5ML PO SUSP: 30.0000 mL | Freq: Four times a day (QID) | ORAL | Status: DC | PRN

## 2012-06-28 MED ORDER — HYDROCODONE-ACETAMINOPHEN 5-325 MG PO TABS
2.0000 | ORAL_TABLET | Freq: Four times a day (QID) | ORAL | Status: DC | PRN
  Administered 2012-06-28 – 2012-06-29 (×3): 2 via ORAL
  Filled 2012-06-28 (×4): qty 2

## 2012-06-28 MED ORDER — SERTRALINE HCL 50 MG PO TABS
50.0000 mg | ORAL_TABLET | Freq: Every day | ORAL | Status: DC
  Administered 2012-06-28 – 2012-06-29 (×2): 50 mg via ORAL
  Filled 2012-06-28 (×2): qty 1

## 2012-06-28 MED ORDER — ASPIRIN 81 MG PO CHEW
81.0000 mg | CHEWABLE_TABLET | Freq: Every day | ORAL | Status: DC
  Administered 2012-06-28 – 2012-06-29 (×2): 81 mg via ORAL
  Filled 2012-06-28 (×2): qty 1

## 2012-06-28 MED ORDER — ONDANSETRON HCL 4 MG PO TABS: 4.0000 mg | ORAL_TABLET | Freq: Four times a day (QID) | ORAL | Status: DC | PRN

## 2012-06-28 MED ORDER — DIAZEPAM 5 MG PO TABS: 5.0000 mg | ORAL_TABLET | Freq: Three times a day (TID) | ORAL | Status: DC | PRN

## 2012-06-28 MED ORDER — SODIUM CHLORIDE 0.9 % IV SOLN: 250.0000 mL | INTRAVENOUS | Status: DC | PRN

## 2012-06-28 MED ORDER — ACETAMINOPHEN 325 MG PO TABS
650.0000 mg | ORAL_TABLET | Freq: Four times a day (QID) | ORAL | Status: DC | PRN
  Administered 2012-06-28: 650 mg via ORAL
  Filled 2012-06-28: qty 1

## 2012-06-28 MED ORDER — SODIUM CHLORIDE 0.9 % IJ SOLN: 3.0000 mL | INTRAMUSCULAR | Status: DC | PRN

## 2012-06-28 NOTE — ED Notes (Signed)
Report attempted.  Nurse to call back.

## 2012-06-28 NOTE — ED Notes (Signed)
Pt is currently in MRI dept .

## 2012-06-28 NOTE — ED Notes (Signed)
Pt reports that she started having left side weakness and "unable to move her left leg" since around 3:00am.  Didn't call ems due to no cell phone, once she found a ride she came for treatment. Alert and able to answer all ?'s, pt being transport to mri, rn unable to perform complete neuro exam.

## 2012-06-28 NOTE — ED Provider Notes (Signed)
History     CSN: [id number]  Arrival date & time 06/28/12  1402   First MD Initiated Contact with Patient 06/28/12 1416      Chief Complaint  Patient presents with  . Numbness    (Consider location/radiation/quality/duration/timing/severity/associated sxs/prior treatment) HPI Comments: Patient presents with numbness and tingling that started around 3:30 AM on the left side of her body. This progressed to involve her arm and leg and left side causing her weakness and difficulty walking. She states she could not get a ride to the hospital until now. She denies any chest pain, shortness of breath, nausea or vomiting. She has a history of chronic back pain and depression. She denies any difficulty breathing or swallowing. She has some difficulty articulating some of her words. She denies any dizziness, lightheadedness or vertigo. She denies any blurry vision or double vision.  The history is provided by the patient.    Past Medical History  Diagnosis Date  . Back pain   . Sciatica   . Panic attacks   . Depression     Past Surgical History  Procedure Date  . Back surgery   . Neck surgery     History reviewed. No pertinent family history.  History  Substance Use Topics  . Smoking status: Current Every Day Smoker  . Smokeless tobacco: Not on file  . Alcohol Use: No    OB History    Grav Para Term Preterm Abortions TAB SAB Ect Mult Living                  Review of Systems  Constitutional: Negative for fever and activity change.  HENT: Negative for congestion and rhinorrhea.   Eyes: Negative for visual disturbance.  Respiratory: Negative for cough, chest tightness and shortness of breath.   Cardiovascular: Negative for chest pain.  Gastrointestinal: Negative for nausea, vomiting and abdominal pain.  Genitourinary: Negative for dysuria, hematuria, vaginal bleeding and vaginal discharge.  Musculoskeletal: Negative for back pain.  Skin: Negative for rash.    Neurological: Positive for weakness, light-headedness and numbness. Negative for dizziness.    Allergies  Latex and Penicillins  Home Medications   Current Outpatient Rx  Name  Route  Sig  Dispense  Refill  . ASPIRIN-ACETAMINOPHEN-CAFFEINE 250-250-65 MG PO TABS   Oral   Take 2 tablets by mouth every 6 (six) hours as needed. For migraine         . BC HEADACHE POWDER PO   Oral   Take 1 packet by mouth as needed. For pain         . DIAZEPAM 5 MG PO TABS   Oral   Take 5 mg by mouth every 8 (eight) hours as needed. For spasms          . HYDROCODONE-ACETAMINOPHEN 10-325 MG PO TABS   Oral   Take 1 tablet by mouth every 6 (six) hours as needed. For pain         . SERTRALINE HCL 50 MG PO TABS   Oral   Take 50 mg by mouth daily.             BP 154/80  Pulse 61  Temp 97.4 F (36.3 C) (Oral)  Resp 18  Ht 5\' 3"  (1.6 m)  Wt 140 lb (63.504 kg)  BMI 24.80 kg/m2  SpO2 96%  Physical Exam  Constitutional: She is oriented to person, place, and time. She appears well-developed and well-nourished. No distress.  HENT:  Head: Normocephalic and  atraumatic.  Mouth/Throat: Oropharynx is clear and moist. No oropharyngeal exudate.  Eyes: Conjunctivae normal and EOM are normal. Pupils are equal, round, and reactive to light.  Neck: Normal range of motion. Neck supple.  Cardiovascular: Normal rate, regular rhythm and normal heart sounds.   No murmur heard. Pulmonary/Chest: Effort normal and breath sounds normal. No respiratory distress.  Abdominal: Soft. There is no tenderness. There is no rebound and no guarding.  Musculoskeletal: Normal range of motion. She exhibits no edema and no tenderness.  Neurological: She is alert and oriented to person, place, and time. No cranial nerve deficit. She exhibits normal muscle tone. Coordination normal.       Smile is symmetric, tongue is midline. There is no nystagmus. Visual fields full to confrontation. There is no ataxia finger to nose.  Patient has weakness in left grip strengths and weak push and pull of forearm. She is unable to hold left leg off the bed against gravity.   Skin: Skin is warm.    ED Course  Procedures (including critical care time)   Labs Reviewed  CBC WITH DIFFERENTIAL  COMPREHENSIVE METABOLIC PANEL  TROPONIN I  URINALYSIS, ROUTINE W REFLEX MICROSCOPIC  URINE RAPID DRUG SCREEN (HOSP PERFORMED)   No results found.   No diagnosis found.    MDM  Weakness with left-sided numbness and tingling. Concern for CVA. Patient not TPA candidate secondary to delayed presentation. Last seen normal 12 hours ago.  MRI is negative for acute infarct. Patient states her numbness and tingling or worse in the left side but she feels that her strength is better.  Improvement of L sided strength from initial evaluation. D/w Dr. Ouida Sills who will admit for possible TIA.   Date: 06/28/2012  Rate: 57  Rhythm: normal sinus rhythm  QRS Axis: normal  Intervals: normal  ST/T Wave abnormalities: normal  Conduction Disutrbances:none  Narrative Interpretation:   Old EKG Reviewed: unchanged      Glynn Octave, MD 06/29/12 1455

## 2012-06-28 NOTE — ED Notes (Signed)
Lt side numbness , onset 330 am.   Lt grip weak,  Says lt leg weak.  Alert,talking

## 2012-06-29 DIAGNOSIS — R4182 Altered mental status, unspecified: Secondary | ICD-10-CM | POA: Diagnosis present

## 2012-06-29 DIAGNOSIS — F329 Major depressive disorder, single episode, unspecified: Secondary | ICD-10-CM | POA: Diagnosis present

## 2012-06-29 DIAGNOSIS — F32A Depression, unspecified: Secondary | ICD-10-CM | POA: Diagnosis present

## 2012-06-29 DIAGNOSIS — G9389 Other specified disorders of brain: Secondary | ICD-10-CM | POA: Diagnosis present

## 2012-06-29 NOTE — Progress Notes (Signed)
Pt being discharged home today. IV is out, patient is in NAD, all discharge paperwork has been gone over, all questions/concerns have been addressed. Pt is being accompanied outside by boyfriend and staff via wheelchair. All belongings are with patient.
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
## 2012-06-30 NOTE — Discharge Summary (Signed)
Physician Discharge Summary  Patient ID: WILLODEAN LEVEN MRN: [id number] DOB/AGE: [birth year] 56 y.o. Primary Care Physician:No primary provider on file. Admit date: 06/28/2012 Discharge date: 06/30/2012    Discharge Diagnoses:   Principal Problem:  *Altered mental status Active Problems:  Cerebral ventriculomegaly  Depression     Medication List     As of 06/30/2012  8:46 AM    TAKE these medications         BC HEADACHE POWDER PO   Take 1 packet by mouth daily as needed. For pain      diazepam 5 MG tablet   Commonly known as: VALIUM   Take 5 mg by mouth every 8 (eight) hours as needed. For spasms      HYDROcodone-acetaminophen 10-325 MG per tablet   Commonly known as: NORCO   Take 1 tablet by mouth every 6 (six) hours as needed. For pain      sertraline 50 MG tablet   Commonly known as: ZOLOFT   Take 50 mg by mouth daily.        Discharged Condition: Improved    Consults: None  Significant Diagnostic Studies: Mr Brain 81 Contrast  06/28/2012  *RADIOLOGY REPORT*  Clinical Data: 56 year old female new onset left side weakness and numbness and 3:00 a.m.  MRI HEAD WITHOUT CONTRAST  Technique:  Multiplanar, multiecho pulse sequences of the brain and surrounding structures were obtained according to standard protocol without intravenous contrast.  Comparison: Head CT 02/11/2012.  Brain MRI 12/08/2009.  Findings: Chronic ventriculomegaly is stable. As before, the cerebral aqueduct is difficult to delineate at the lower mid brain. No restricted diffusion to suggest acute infarction.  No midline shift, mass effect, evidence of mass lesion, extra-axial collection or acute intracranial hemorrhage.  Cervicomedullary junction and pituitary are within normal limits.  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  Stable gray-white matter signal.  Negative visualized cervical spine.  Normal  bone marrow signal. Visualized orbit soft tissues are within normal limits.  Visualized paranasal sinuses and mastoids are clear.  Negative scalp soft tissues.  IMPRESSION: 1. No acute intracranial abnormality. 2.  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   Original Report Authenticated By: Erskine Speed, M.D.    Mr Lumbar Spine Wo Contrast  06/28/2012  *RADIOLOGY REPORT*  Clinical Data: 56 year old female with back pain, left extremity weakness and numbness.  Prior lumbar surgery.  MRI LUMBAR SPINE WITHOUT CONTRAST  Technique:  Multiplanar and multiecho pulse sequences of the lumbar spine were obtained without intravenous contrast.  Comparison: CT lumbar myelogram 09/27/2010.  Lumbar MRI 12/01/2009.  Findings: Sequelae of L4-L5 decompression and fusion again noted. Stable lumbar vertebral height and alignment. No marrow edema or evidence of acute osseous abnormality.  Stable paraspinal soft tissues.  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  T11-T12:  Negative.  T12-L1:  Negative.  L1-L2:  Mild facet hypertrophy is stable.  No stenosis.  L2-L3:  Stable minor disc bulge and mild facet hypertrophy.  No stenosis.  L3-L4:  Trace anterolisthesis of L3 on L4 is increased since 2011, stable more recently.  Chronic circumferential disc bulge is mildly increased.  Severe facet and ligament flavum hypertrophy also has mildly increased.  Left lateral recess stenosis and mild to moderate spinal stenosis has increased since the comparison.  No significant foraminal stenosis.  L4-L5:  Stable postoperative appearance status post decompression and fusion.  Widely patent thecal sac with no stenosis.  L5-S1:  Chronic disc desiccation and disc space loss. Chronic bulky left eccentric circumferential disc osteophyte complex.  Moderate facet and ligament flavum hypertrophy is stable.  Chronic left lateral recess stenosis is stable or mildly increased.  Chronic  severe left L5 foraminal stenosis has not significantly changed. Mild right L5 foraminal stenosis is new / increased.  IMPRESSION: 1.  Stable L4-L5 decompression and fusion with no adverse features. 2.  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 3.  Chronic disc and facet degeneration at L5 S1 with stable to mildly increased chronic left lateral recess stenosis and stable severe left L5 foraminal stenosis.   Original Report Authenticated By: Erskine Speed, M.D.     Lab Results: Basic Metabolic Panel:  Basename 06/28/12 1534  NA 141  K 3.5  CL 101  CO2 28  GLUCOSE 93  BUN 12  CREATININE 0.52  CALCIUM 10.2  MG --  PHOS --   Liver Function Tests:  [facility] 06/28/12 1534  AST 18  ALT 11  ALKPHOS 82  BILITOT 0.2*  PROT 7.4  ALBUMIN 3.9     CBC:  Basename 06/28/12 1534  WBC 10.9*  NEUTROABS 4.8  HGB 15.2*  HCT 45.4  MCV 94.4  PLT 274    No results found for this or any previous visit (from the past 240 hour(s)).   Hospital Course: She was brought in for observation after changing mental status. She has had multiple similar episodes in the past. This is thought to be related to her chronic abnormalities in her brain which are thought to be associated with aqua ductal stenosis. She improved in the next day was back to baseline. She was ready for discharge.  Discharge Exam: Blood pressure 177/94, pulse 61, temperature 97.8 F (36.6 C), temperature source Oral, resp. rate 18, height 5\' 3"  (1.6 m), weight 63.322 kg (139 lb 9.6 oz), SpO2 96.00%. Percent on nervous system examination was back to baseline she does have some weak chronic weakness has some chronic speech impediment. No focal findings. Her chest was clear. Her heart was regular.  Disposition: Home she will follow with her neurosurgeon and with her neurologist as well as in my office      Discharge Orders    Future Orders  Please Complete By Expires   Discharge patient           Signed: Fredirick Maudlin Pager [phone]  06/30/2012, 8:46 AM

## 2012-08-08 NOTE — Progress Notes (Signed)
UR Chart Review Completed  

## 2012-08-09 ENCOUNTER — Other Ambulatory Visit (HOSPITAL_COMMUNITY): Payer: Self-pay | Admitting: Neurosurgery

## 2013-01-29 ENCOUNTER — Emergency Department (HOSPITAL_COMMUNITY): Payer: Medicaid Other

## 2013-01-29 ENCOUNTER — Emergency Department (HOSPITAL_COMMUNITY)
Admission: EM | Admit: 2013-01-29 | Discharge: 2013-01-29 | Disposition: A | Discharge status: Home / Self Care | Payer: Medicaid Other | Attending: Emergency Medicine | Admitting: Emergency Medicine

## 2013-01-29 ENCOUNTER — Encounter (HOSPITAL_COMMUNITY): Payer: Self-pay | Admitting: Emergency Medicine

## 2013-01-29 DIAGNOSIS — R112 Nausea with vomiting, unspecified: Secondary | ICD-10-CM | POA: Insufficient documentation

## 2013-01-29 DIAGNOSIS — F3289 Other specified depressive episodes: Secondary | ICD-10-CM | POA: Insufficient documentation

## 2013-01-29 DIAGNOSIS — J4489 Other specified chronic obstructive pulmonary disease: Secondary | ICD-10-CM | POA: Insufficient documentation

## 2013-01-29 DIAGNOSIS — Z880 Allergy status to penicillin: Secondary | ICD-10-CM | POA: Insufficient documentation

## 2013-01-29 DIAGNOSIS — Z8719 Personal history of other diseases of the digestive system: Secondary | ICD-10-CM | POA: Insufficient documentation

## 2013-01-29 DIAGNOSIS — Z91040 Latex allergy status: Secondary | ICD-10-CM | POA: Insufficient documentation

## 2013-01-29 DIAGNOSIS — Z8669 Personal history of other diseases of the nervous system and sense organs: Secondary | ICD-10-CM | POA: Insufficient documentation

## 2013-01-29 DIAGNOSIS — J449 Chronic obstructive pulmonary disease, unspecified: Secondary | ICD-10-CM | POA: Insufficient documentation

## 2013-01-29 DIAGNOSIS — G8929 Other chronic pain: Secondary | ICD-10-CM | POA: Insufficient documentation

## 2013-01-29 DIAGNOSIS — F329 Major depressive disorder, single episode, unspecified: Secondary | ICD-10-CM | POA: Insufficient documentation

## 2013-01-29 DIAGNOSIS — N390 Urinary tract infection, site not specified: Secondary | ICD-10-CM | POA: Insufficient documentation

## 2013-01-29 DIAGNOSIS — K219 Gastro-esophageal reflux disease without esophagitis: Secondary | ICD-10-CM | POA: Insufficient documentation

## 2013-01-29 DIAGNOSIS — F17200 Nicotine dependence, unspecified, uncomplicated: Secondary | ICD-10-CM | POA: Insufficient documentation

## 2013-01-29 HISTORY — DX: Unspecified speech disturbances: R47.9

## 2013-01-29 HISTORY — DX: Dorsalgia, unspecified: M54.9

## 2013-01-29 HISTORY — DX: Esophagitis, unspecified without bleeding: K20.90

## 2013-01-29 HISTORY — DX: Cervicalgia: M54.2

## 2013-01-29 HISTORY — DX: Other specified disorders of brain: G93.89

## 2013-01-29 HISTORY — DX: Esophagitis, unspecified: K20.9

## 2013-01-29 HISTORY — DX: Chronic obstructive pulmonary disease, unspecified: J44.9

## 2013-01-29 HISTORY — DX: Gastro-esophageal reflux disease without esophagitis: K21.9

## 2013-01-29 HISTORY — DX: Other chronic pain: G89.29

## 2013-01-29 LAB — COMPREHENSIVE METABOLIC PANEL
AST: 22 U/L (ref 0–37)
Albumin: 3.8 g/dL (ref 3.5–5.2)
Calcium: 9.7 mg/dL (ref 8.4–10.5)
Chloride: 92 mEq/L — ABNORMAL LOW (ref 96–112)
Creatinine, Ser: 0.62 mg/dL (ref 0.50–1.10)

## 2013-01-29 LAB — URINALYSIS, ROUTINE W REFLEX MICROSCOPIC
Bilirubin Urine: NEGATIVE
Glucose, UA: 100 mg/dL — AB
Nitrite: NEGATIVE
Specific Gravity, Urine: >1.03 — ABNORMAL HIGH (ref 1.005–1.030)
pH: 6.5 (ref 5.0–8.0)

## 2013-01-29 LAB — URINE MICROSCOPIC-ADD ON

## 2013-01-29 LAB — CBC WITH DIFFERENTIAL/PLATELET
Basophils Absolute: 0 10*3/uL (ref 0.0–0.1)
Basophils Relative: 0 % (ref 0–1)
Eosinophils Relative: 0 % (ref 0–5)
HCT: 47.5 % — ABNORMAL HIGH (ref 36.0–46.0)
MCHC: 34.5 g/dL (ref 30.0–36.0)
Monocytes Absolute: 0.7 10*3/uL (ref 0.1–1.0)
Neutro Abs: 12.8 10*3/uL — ABNORMAL HIGH (ref 1.7–7.7)
RDW: 13.7 % (ref 11.5–15.5)

## 2013-01-29 LAB — TROPONIN I: Troponin I: <0.3 ng/mL (ref <0.30)

## 2013-01-29 MED ORDER — IOHEXOL 300 MG/ML  SOLN
100.0000 mL | Freq: Once | INTRAMUSCULAR | Status: AC | PRN
  Administered 2013-01-29: 100 mL via INTRAVENOUS

## 2013-01-29 MED ORDER — NITROFURANTOIN MONOHYD MACRO 100 MG PO CAPS: 100.0000 mg | ORAL_CAPSULE | Freq: Two times a day (BID) | ORAL | Status: DC

## 2013-01-29 MED ORDER — ONDANSETRON HCL 4 MG/2ML IJ SOLN
4.0000 mg | INTRAMUSCULAR | Status: AC | PRN
  Administered 2013-01-29 (×2): 4 mg via INTRAVENOUS
  Filled 2013-01-29 (×2): qty 2

## 2013-01-29 MED ORDER — PANTOPRAZOLE SODIUM 40 MG IV SOLR
40.0000 mg | Freq: Once | INTRAVENOUS | Status: AC
  Administered 2013-01-29: 40 mg via INTRAVENOUS
  Filled 2013-01-29: qty 40

## 2013-01-29 MED ORDER — FAMOTIDINE IN NACL 20-0.9 MG/50ML-% IV SOLN
20.0000 mg | Freq: Once | INTRAVENOUS | Status: AC
  Administered 2013-01-29: 20 mg via INTRAVENOUS
  Filled 2013-01-29: qty 50

## 2013-01-29 MED ORDER — ONDANSETRON HCL 4 MG PO TABS: 4.0000 mg | ORAL_TABLET | Freq: Three times a day (TID) | ORAL | Status: DC | PRN

## 2013-01-29 MED ORDER — MORPHINE SULFATE 4 MG/ML IJ SOLN
4.0000 mg | INTRAMUSCULAR | Status: DC | PRN
  Administered 2013-01-29: 4 mg via INTRAVENOUS
  Filled 2013-01-29 (×2): qty 1

## 2013-01-29 MED ORDER — SODIUM CHLORIDE 0.9 % IV SOLN
INTRAVENOUS | Status: DC
  Administered 2013-01-29: 15:00:00 via INTRAVENOUS

## 2013-01-29 MED ORDER — IOHEXOL 300 MG/ML  SOLN
50.0000 mL | Freq: Once | INTRAMUSCULAR | Status: AC | PRN
  Administered 2013-01-29: 50 mL via ORAL

## 2013-01-29 MED ORDER — NITROFURANTOIN MONOHYD MACRO 100 MG PO CAPS
100.0000 mg | ORAL_CAPSULE | ORAL | Status: AC
  Administered 2013-01-29: 100 mg via ORAL
  Filled 2013-01-29: qty 1
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
## 2013-01-29 NOTE — ED Notes (Signed)
Patient c/o abd pain in umbilicus region with nausea, vomiting, and fever since yesterday. Patient reports generalized weakness. Denies any diarrhea.

## 2013-01-30 LAB — URINE CULTURE: Colony Count: NO GROWTH

## 2013-11-13 ENCOUNTER — Ambulatory Visit (HOSPITAL_COMMUNITY)
Admission: RE | Admit: 2013-11-13 | Discharge: 2013-11-13 | Disposition: A | Discharge status: Home / Self Care | Payer: Medicaid Other | Source: Ambulatory Visit | Attending: Pulmonary Disease | Admitting: Pulmonary Disease

## 2013-11-13 ENCOUNTER — Other Ambulatory Visit (HOSPITAL_COMMUNITY): Payer: Self-pay | Admitting: Pulmonary Disease

## 2013-11-13 DIAGNOSIS — R05 Cough: Secondary | ICD-10-CM | POA: Insufficient documentation

## 2013-11-13 DIAGNOSIS — R059 Cough, unspecified: Secondary | ICD-10-CM

## 2013-11-13 LAB — HM PAP SMEAR: HM Pap smear: NEGATIVE

## 2013-11-17 ENCOUNTER — Other Ambulatory Visit (HOSPITAL_COMMUNITY): Payer: Self-pay | Admitting: Pulmonary Disease

## 2013-11-17 DIAGNOSIS — R1900 Intra-abdominal and pelvic swelling, mass and lump, unspecified site: Secondary | ICD-10-CM

## 2013-11-19 ENCOUNTER — Ambulatory Visit (HOSPITAL_COMMUNITY)
Admission: RE | Admit: 2013-11-19 | Discharge: 2013-11-19 | Disposition: A | Discharge status: Home / Self Care | Payer: Medicaid Other | Source: Ambulatory Visit | Attending: Pulmonary Disease | Admitting: Pulmonary Disease

## 2013-11-19 ENCOUNTER — Other Ambulatory Visit (HOSPITAL_COMMUNITY): Payer: Self-pay | Admitting: Pulmonary Disease

## 2013-11-19 DIAGNOSIS — R1900 Intra-abdominal and pelvic swelling, mass and lump, unspecified site: Secondary | ICD-10-CM

## 2014-06-08 ENCOUNTER — Encounter (HOSPITAL_COMMUNITY): Payer: Self-pay | Admitting: Emergency Medicine

## 2014-12-20 ENCOUNTER — Emergency Department (HOSPITAL_COMMUNITY): Payer: Medicaid Other

## 2014-12-20 ENCOUNTER — Emergency Department (HOSPITAL_COMMUNITY)
Admission: EM | Admit: 2014-12-20 | Discharge: 2014-12-20 | Disposition: A | Discharge status: Home / Self Care | Payer: Medicaid Other | Attending: Emergency Medicine | Admitting: Emergency Medicine

## 2014-12-20 ENCOUNTER — Encounter (HOSPITAL_COMMUNITY): Payer: Self-pay | Admitting: Emergency Medicine

## 2014-12-20 DIAGNOSIS — Z8719 Personal history of other diseases of the digestive system: Secondary | ICD-10-CM | POA: Diagnosis not present

## 2014-12-20 DIAGNOSIS — W57XXXA Bitten or stung by nonvenomous insect and other nonvenomous arthropods, initial encounter: Secondary | ICD-10-CM | POA: Diagnosis not present

## 2014-12-20 DIAGNOSIS — F410 Panic disorder [episodic paroxysmal anxiety] without agoraphobia: Secondary | ICD-10-CM | POA: Diagnosis not present

## 2014-12-20 DIAGNOSIS — Y998 Other external cause status: Secondary | ICD-10-CM | POA: Diagnosis not present

## 2014-12-20 DIAGNOSIS — Z91040 Latex allergy status: Secondary | ICD-10-CM | POA: Diagnosis not present

## 2014-12-20 DIAGNOSIS — Z8739 Personal history of other diseases of the musculoskeletal system and connective tissue: Secondary | ICD-10-CM | POA: Diagnosis not present

## 2014-12-20 DIAGNOSIS — Z720 Tobacco use: Secondary | ICD-10-CM | POA: Insufficient documentation

## 2014-12-20 DIAGNOSIS — Y9289 Other specified places as the place of occurrence of the external cause: Secondary | ICD-10-CM | POA: Insufficient documentation

## 2014-12-20 DIAGNOSIS — S30861A Insect bite (nonvenomous) of abdominal wall, initial encounter: Secondary | ICD-10-CM | POA: Diagnosis present

## 2014-12-20 DIAGNOSIS — Z880 Allergy status to penicillin: Secondary | ICD-10-CM | POA: Insufficient documentation

## 2014-12-20 DIAGNOSIS — Y9389 Activity, other specified: Secondary | ICD-10-CM | POA: Insufficient documentation

## 2014-12-20 DIAGNOSIS — Z79899 Other long term (current) drug therapy: Secondary | ICD-10-CM | POA: Diagnosis not present

## 2014-12-20 DIAGNOSIS — J449 Chronic obstructive pulmonary disease, unspecified: Secondary | ICD-10-CM | POA: Insufficient documentation

## 2014-12-20 DIAGNOSIS — G8929 Other chronic pain: Secondary | ICD-10-CM | POA: Diagnosis not present

## 2014-12-20 DIAGNOSIS — F329 Major depressive disorder, single episode, unspecified: Secondary | ICD-10-CM | POA: Insufficient documentation

## 2014-12-20 DIAGNOSIS — R21 Rash and other nonspecific skin eruption: Secondary | ICD-10-CM | POA: Insufficient documentation

## 2014-12-20 LAB — COMPREHENSIVE METABOLIC PANEL
ALT: 13 U/L — ABNORMAL LOW (ref 14–54)
AST: 19 U/L (ref 15–41)
Albumin: 3.6 g/dL (ref 3.5–5.0)
Alkaline Phosphatase: 80 U/L (ref 38–126)
Anion gap: 9 (ref 5–15)
BUN: 18 mg/dL (ref 6–20)
CO2: 27 mmol/L (ref 22–32)
Calcium: 9.5 mg/dL (ref 8.9–10.3)
Chloride: 102 mmol/L (ref 101–111)
Creatinine, Ser: 0.66 mg/dL (ref 0.44–1.00)
Glucose, Bld: 85 mg/dL (ref 65–99)
POTASSIUM: 4.1 mmol/L (ref 3.5–5.1)
Sodium: 138 mmol/L (ref 135–145)
Total Bilirubin: 0.4 mg/dL (ref 0.3–1.2)
Total Protein: 6.9 g/dL (ref 6.5–8.1)

## 2014-12-20 LAB — URINALYSIS, ROUTINE W REFLEX MICROSCOPIC
Bilirubin Urine: NEGATIVE
GLUCOSE, UA: NEGATIVE mg/dL
Ketones, ur: NEGATIVE mg/dL
Leukocytes, UA: NEGATIVE
NITRITE: NEGATIVE
PH: 5.5 (ref 5.0–8.0)
Specific Gravity, Urine: 1.01 (ref 1.005–1.030)
Urobilinogen, UA: 0.2 mg/dL (ref 0.0–1.0)

## 2014-12-20 LAB — CBC WITH DIFFERENTIAL/PLATELET
BASOS ABS: 0 10*3/uL (ref 0.0–0.1)
Basophils Relative: 0 % (ref 0–1)
EOS ABS: 0.1 10*3/uL (ref 0.0–0.7)
Eosinophils Relative: 1 % (ref 0–5)
HEMATOCRIT: 41.9 % (ref 36.0–46.0)
HEMOGLOBIN: 13.8 g/dL (ref 12.0–15.0)
LYMPHS ABS: 4.6 10*3/uL — AB (ref 0.7–4.0)
Lymphocytes Relative: 36 % (ref 12–46)
MCH: 32.3 pg (ref 26.0–34.0)
MCHC: 32.9 g/dL (ref 30.0–36.0)
MCV: 98.1 fL (ref 78.0–100.0)
MONO ABS: 0.9 10*3/uL (ref 0.1–1.0)
MONOS PCT: 7 % (ref 3–12)
Neutro Abs: 7 10*3/uL (ref 1.7–7.7)
Neutrophils Relative %: 56 % (ref 43–77)
Platelets: 366 10*3/uL (ref 150–400)
RBC: 4.27 MIL/uL (ref 3.87–5.11)
RDW: 14.4 % (ref 11.5–15.5)
WBC: 12.6 10*3/uL — ABNORMAL HIGH (ref 4.0–10.5)

## 2014-12-20 LAB — URINE MICROSCOPIC-ADD ON

## 2014-12-20 LAB — RAPID URINE DRUG SCREEN, HOSP PERFORMED
Amphetamines: NOT DETECTED
BARBITURATES: NOT DETECTED
BENZODIAZEPINES: POSITIVE — AB
COCAINE: NOT DETECTED
OPIATES: NOT DETECTED
Tetrahydrocannabinol: NOT DETECTED

## 2014-12-20 LAB — ETHANOL: Alcohol, Ethyl (B): 6 mg/dL — ABNORMAL HIGH (ref <5)

## 2014-12-20 MED ORDER — DOXYCYCLINE HYCLATE 100 MG PO TABS
100.0000 mg | ORAL_TABLET | Freq: Once | ORAL | Status: AC
  Administered 2014-12-20: 100 mg via ORAL
  Filled 2014-12-20: qty 1

## 2014-12-20 MED ORDER — DOXYCYCLINE HYCLATE 100 MG PO CAPS: 100.0000 mg | ORAL_CAPSULE | Freq: Two times a day (BID) | ORAL | Status: DC

## 2014-12-20 MED ORDER — HYDROCODONE-ACETAMINOPHEN 5-325 MG PO TABS
1.0000 | ORAL_TABLET | Freq: Once | ORAL | Status: AC
  Administered 2014-12-20: 1 via ORAL
  Filled 2014-12-20: qty 1

## 2014-12-20 NOTE — Discharge Instructions (Signed)
Tick Bite Information Ticks are insects that attach themselves to the skin and draw blood for food. There are various types of ticks. Common types include wood ticks and deer ticks. Most ticks live in shrubs and grassy areas. Ticks can climb onto your body when you make contact with leaves or grass where the tick is waiting. The most common places on the body for ticks to attach themselves are the scalp, neck, armpits, waist, and groin. Most tick bites are harmless, but sometimes ticks carry germs that cause diseases. These germs can be spread to a person during the tick's feeding process. The chance of a disease spreading through a tick bite depends on:   The type of tick.  Time of year.   How long the tick is attached.   Geographic location.  HOW CAN YOU PREVENT TICK BITES? Take these steps to help prevent tick bites when you are outdoors:  Wear protective clothing. Long sleeves and long pants are best.   Wear white clothes so you can see ticks more easily.  Tuck your pant legs into your socks.   If walking on a trail, stay in the middle of the trail to avoid brushing against bushes.  Avoid walking through areas with long grass.  Put insect repellent on all exposed skin and along boot tops, pant legs, and sleeve cuffs.   Check clothing, hair, and skin repeatedly and before going inside.   Brush off any ticks that are not attached.  Take a shower or bath as soon as possible after being outdoors.  WHAT IS THE PROPER WAY TO REMOVE A TICK? Ticks should be removed as soon as possible to help prevent diseases caused by tick bites. 1. If latex gloves are available, put them on before trying to remove a tick.  2. Using fine-point tweezers, grasp the tick as close to the skin as possible. You may also use curved forceps or a tick removal tool. Grasp the tick as close to its head as possible. Avoid grasping the tick on its body. 3. Pull gently with steady upward pressure until  the tick lets go. Do not twist the tick or jerk it suddenly. This may break off the tick's head or mouth parts. 4. Do not squeeze or crush the tick's body. This could force disease-carrying fluids from the tick into your body.  5. After the tick is removed, wash the bite area and your hands with soap and water or other disinfectant such as alcohol. 6. Apply a small amount of antiseptic cream or ointment to the bite site.  7. Wash and disinfect any instruments that were used.  Do not try to remove a tick by applying a hot match, petroleum jelly, or fingernail polish to the tick. These methods do not work and may increase the chances of disease being spread from the tick bite.  WHEN SHOULD YOU SEEK MEDICAL CARE? Contact your health care provider if you are unable to remove a tick from your skin or if a part of the tick breaks off and is stuck in the skin.  After a tick bite, you need to be aware of signs and symptoms that could be related to diseases spread by ticks. Contact your health care provider if you develop any of the following in the days or weeks after the tick bite:  Unexplained fever.  Rash. A circular rash that appears days or weeks after the tick bite may indicate the possibility of Lyme disease. The rash may resemble   a target with a bull's-eye and may occur at a different part of your body than the tick bite.  Redness and swelling in the area of the tick bite.   Tender, swollen lymph glands.   Diarrhea.   Weight loss.   Cough.   Fatigue.   Muscle, joint, or bone pain.   Abdominal pain.   Headache.   Lethargy or a change in your level of consciousness.  Difficulty walking or moving your legs.   Numbness in the legs.   Paralysis.  Shortness of breath.   Confusion.   Repeated vomiting.  Document Released: 07/21/2000 Document Revised: 05/14/2013 Document Reviewed: 01/01/2013 [employer] Patient Information 2015 [employer]. This information is  not intended to replace advice given to you by your health care provider. Make sure you discuss any questions you have with your health care provider.  

## 2014-12-20 NOTE — ED Provider Notes (Signed)
CSN: [id number]     Arrival date & time 12/20/14  1447 History   First MD Initiated Contact with Patient 12/20/14 1615     Chief Complaint  Patient presents with  . Insect Bite   HPI Pt presents to the ED with complaints of left flank pain.  She noticed a tick bite 3 weeks ago.  Patient developed a rash around the location of the tick bite on her left flank. The rash has stayed in that one area. She has not had any other rashes. He has had some mild allergies. She denied having any fever when speaking to the triage nurse but she told me she has had some temperatures up to 101. She denies any trouble with vomiting or diarrhea. No cough or shortness of breath. Patient also is concerned because she feels like she is having some visual hallucinations. She has seen people that aren't really there. She says she has a video camera in the house and has not seen any one on these recordings. Patient denies any headaches. Denies any auditory hallucinations. No complaints of depression or suicidal ideation. She denies any drug or alcohol use. Past Medical History  Diagnosis Date  . Sciatica   . Panic attacks   . Depression   . Chronic back pain   . Speech abnormality     chronic  . Chronic neck pain   . COPD (chronic obstructive pulmonary disease)   . GERD (gastroesophageal reflux disease)   . Esophagitis   . 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 Back surgery    . Neck surgery    . 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 Oophorectomy    . Nephrectomy Left 1978    s/p trauma   Family History  Problem Relation Age of Onset  . Heart failure Mother   . Stroke Father   . Seizures Father    History  Substance Use Topics  . Smoking status: Current Every Day Smoker -- 2.00 packs/day for 40 years    Types: Cigarettes  . Smokeless tobacco: Never Used  . Alcohol Use: No      Comment: occassionally   OB History    Gravida Para Term Preterm AB TAB SAB Ectopic Multiple Living   4 4 4       4      Review of Systems  All other systems reviewed and are negative.     Allergies  Latex and Penicillins  Home Medications   Prior to Admission medications   Medication Sig Start Date End Date Taking? Authorizing Provider  albuterol (PROVENTIL HFA;VENTOLIN HFA) 108 (90 BASE) MCG/ACT inhaler Inhale 2 puffs into the lungs every 6 (six) hours as needed for wheezing.    Historical Provider, MD  Aspirin-Salicylamide-Caffeine (BC HEADACHE POWDER PO) Take 1 packet by mouth daily as needed. For pain    Historical Provider, MD  diazepam (VALIUM) 5 MG tablet Take 5 mg by mouth every 8 (eight) hours as needed (muscle relaxer). For spasms    Historical Provider, MD  doxycycline (VIBRAMYCIN) 100 MG capsule Take 1 capsule (100 mg total) by mouth 2 (two) times daily. 12/20/14   [name], MD  HYDROcodone-acetaminophen (NORCO) 10-325 MG per tablet Take 1 tablet by mouth every 6 (six) hours as needed. For pain    Historical Provider, MD  nitrofurantoin, macrocrystal-monohydrate, (MACROBID) 100 MG  capsule Take 1 capsule (100 mg total) by mouth 2 (two) times daily. 01/29/13   [name], DO  ondansetron (ZOFRAN) 4 MG tablet Take 1 tablet (4 mg total) by mouth every 8 (eight) hours as needed for nausea. 01/29/13   [name], DO  sertraline (ZOLOFT) 50 MG tablet Take 50 mg by mouth daily.      Historical Provider, MD  zolpidem (AMBIEN) 10 MG tablet Take 10 mg by mouth at bedtime as needed for sleep.    Historical Provider, MD   BP 128/98 mmHg  Pulse 88  Temp(Src) 98 F (36.7 C) (Oral)  Resp 18  Ht 5\' 3"  (1.6 m)  Wt 120 lb (54.432 kg)  BMI 21.26 kg/m2  SpO2 99% Physical Exam  Constitutional: She is oriented to person, place, and time. She appears well-developed and well-nourished. No distress.  HENT:  Head: Normocephalic and atraumatic.  Right Ear: External ear normal.   Left Ear: External ear normal.  Mouth/Throat: Oropharynx is clear and moist.  Eyes: Conjunctivae are normal. Right eye exhibits no discharge. Left eye exhibits no discharge. No scleral icterus.  Neck: Neck supple. No tracheal deviation present.  Cardiovascular: Normal rate, regular rhythm and intact distal pulses.   Pulmonary/Chest: Effort normal and breath sounds normal. No stridor. No respiratory distress. She has no wheezes. She has no rales.  Abdominal: Soft. Bowel sounds are normal. She exhibits no distension. There is no tenderness. There is no rebound and no guarding.  Musculoskeletal: She exhibits no edema or tenderness.  Neurological: She is alert and oriented to person, place, and time. She has normal strength. No cranial nerve deficit (no facial droop, extraocular movements intact, no slurred speech) or sensory deficit. She exhibits normal muscle tone. She displays no seizure activity. Coordination normal.  5 out of 5 strength bilateral upper extremities and lower extremities, sensation intact in all extremities, no visual field cuts, no left or right sided neglect, , no nystagmus noted   Skin: Skin is warm and dry. Rash noted. No petechiae noted. Rash is maculopapular. Rash is not pustular.  Possibly 2-3 cm oval area of erythematous rash on the left flank, no petechiae or pustules  Psychiatric: She has a normal mood and affect.  Nursing note and vitals reviewed.   ED Course  Procedures (including critical care time) Labs Review Labs Reviewed  URINALYSIS, ROUTINE W REFLEX MICROSCOPIC - Abnormal; Notable for the following:    Hgb urine dipstick TRACE (*)    Protein, ur TRACE (*)    All other components within normal limits  CBC WITH DIFFERENTIAL/PLATELET - Abnormal; Notable for the following:    WBC 12.6 (*)    Lymphs Abs 4.6 (*)    All other components within normal limits  COMPREHENSIVE METABOLIC PANEL - Abnormal; Notable for the following:    ALT 13 (*)    All other  components within normal limits  ETHANOL - Abnormal; Notable for the following:    Alcohol, Ethyl (B) 6 (*)    All other components within normal limits  URINE RAPID DRUG SCREEN (HOSP PERFORMED) - Abnormal; Notable for the following:    Benzodiazepines POSITIVE (*)    All other components within normal limits  URINE MICROSCOPIC-ADD ON    Imaging Review Ct Head Wo Contrast  12/20/2014   CLINICAL DATA:  Hallucinations and generalized weakness. History of cerebral ventriculomegaly. Tick bite 3 weeks ago.  EXAM: CT HEAD WITHOUT CONTRAST  TECHNIQUE: Contiguous axial images were obtained from the base of the  skull through the vertex without intravenous contrast.  COMPARISON:  01/29/2013  FINDINGS: Ventriculomegaly again observed with mild dilatation of the temporal horns of lateral ventricles and dilated third ventricle. Appearance may be due aqueductal stenosis.  The brainstem, cerebellum, cerebral peduncles, thalami, basal ganglia, and basilar cisterns appear unremarkable.  No intracranial hemorrhage, mass lesion, or acute CVA.  Visualized paranasal sinuses appear clear.  No middle ear fluid.  IMPRESSION: 1. Stable chronic ventriculomegaly.  Otherwise normal exam.   Electronically Signed   By: Van Clines M.D.   On: 12/20/2014 18:12     MDM   Final diagnoses:  Tick bite   Patient's laboratory tests are reassuring. Does have benzodiazepines in her urine and a trace amount of alcohol in her blood. It is possible the hallucinations that she was having earlier could be associated with those medications. Patient is not hallucinating here in the emergency room. She is not delirious.  I doubt CNS infection. Overall I doubt [facility] spotted fever. Her symptoms are rather mild. Does have a rash associated with a tick bite however and I will cover empirically with doxycycline as a precaution.   follow up with her primary care doctor.     [name], MD 12/20/14 [phone]

## 2014-12-20 NOTE — ED Notes (Signed)
Patient with no complaints at this time. Respirations even and unlabored. Skin warm/dry. Discharge instructions reviewed with patient at this time. Patient given opportunity to voice concerns/ask questions. Patient alert & orientated x 4.   Patient discharged at this time and left Emergency Department with steady gait.

## 2014-12-20 NOTE — ED Notes (Addendum)
PT states removed tick from left flank x3 weeks ago and noticed area is red and raised. PT states since the tick was removed she has been having visual hallucinations (seeing people walk by her room) and generalized weakness. PT denies any fever. PT denies any recent medication changes and denies any SI/HI or anxiety.

## 2014-12-25 ENCOUNTER — Telehealth (HOSPITAL_COMMUNITY): Payer: Self-pay | Admitting: *Deleted

## 2015-02-18 ENCOUNTER — Ambulatory Visit (HOSPITAL_COMMUNITY): Payer: Self-pay | Admitting: Psychiatry

## 2015-03-22 ENCOUNTER — Emergency Department (HOSPITAL_COMMUNITY): Payer: Medicaid Other

## 2015-03-22 ENCOUNTER — Encounter (HOSPITAL_COMMUNITY): Payer: Self-pay | Admitting: Emergency Medicine

## 2015-03-22 ENCOUNTER — Emergency Department (HOSPITAL_COMMUNITY)
Admission: EM | Admit: 2015-03-22 | Discharge: 2015-03-22 | Disposition: A | Discharge status: Home / Self Care | Payer: Medicaid Other | Attending: Emergency Medicine | Admitting: Emergency Medicine

## 2015-03-22 DIAGNOSIS — F410 Panic disorder [episodic paroxysmal anxiety] without agoraphobia: Secondary | ICD-10-CM | POA: Insufficient documentation

## 2015-03-22 DIAGNOSIS — Y92009 Unspecified place in unspecified non-institutional (private) residence as the place of occurrence of the external cause: Secondary | ICD-10-CM | POA: Insufficient documentation

## 2015-03-22 DIAGNOSIS — Z720 Tobacco use: Secondary | ICD-10-CM | POA: Insufficient documentation

## 2015-03-22 DIAGNOSIS — J449 Chronic obstructive pulmonary disease, unspecified: Secondary | ICD-10-CM | POA: Insufficient documentation

## 2015-03-22 DIAGNOSIS — Z880 Allergy status to penicillin: Secondary | ICD-10-CM | POA: Insufficient documentation

## 2015-03-22 DIAGNOSIS — Z91040 Latex allergy status: Secondary | ICD-10-CM | POA: Insufficient documentation

## 2015-03-22 DIAGNOSIS — S60221A Contusion of right hand, initial encounter: Secondary | ICD-10-CM | POA: Insufficient documentation

## 2015-03-22 DIAGNOSIS — S6991XA Unspecified injury of right wrist, hand and finger(s), initial encounter: Secondary | ICD-10-CM | POA: Diagnosis present

## 2015-03-22 DIAGNOSIS — F329 Major depressive disorder, single episode, unspecified: Secondary | ICD-10-CM | POA: Diagnosis not present

## 2015-03-22 DIAGNOSIS — Z79899 Other long term (current) drug therapy: Secondary | ICD-10-CM | POA: Diagnosis not present

## 2015-03-22 DIAGNOSIS — G8929 Other chronic pain: Secondary | ICD-10-CM | POA: Diagnosis not present

## 2015-03-22 DIAGNOSIS — Y998 Other external cause status: Secondary | ICD-10-CM | POA: Diagnosis not present

## 2015-03-22 DIAGNOSIS — W01198A Fall on same level from slipping, tripping and stumbling with subsequent striking against other object, initial encounter: Secondary | ICD-10-CM | POA: Insufficient documentation

## 2015-03-22 DIAGNOSIS — Y9389 Activity, other specified: Secondary | ICD-10-CM | POA: Insufficient documentation

## 2015-03-22 DIAGNOSIS — S6000XA Contusion of unspecified finger without damage to nail, initial encounter: Secondary | ICD-10-CM

## 2015-03-22 DIAGNOSIS — Z8719 Personal history of other diseases of the digestive system: Secondary | ICD-10-CM | POA: Diagnosis not present

## 2015-03-22 MED ORDER — HYDROCODONE-ACETAMINOPHEN 5-325 MG PO TABS: 1.0000 | ORAL_TABLET | ORAL | Status: DC | PRN

## 2015-03-22 MED ORDER — NAPROXEN 500 MG PO TABS: 500.0000 mg | ORAL_TABLET | Freq: Two times a day (BID) | ORAL | Status: DC

## 2015-03-22 MED ORDER — HYDROCODONE-ACETAMINOPHEN 5-325 MG PO TABS
1.0000 | ORAL_TABLET | Freq: Once | ORAL | Status: AC
  Administered 2015-03-22: 1 via ORAL
  Filled 2015-03-22: qty 1

## 2015-03-22 NOTE — ED Provider Notes (Signed)
CSN: [id number]     Arrival date & time 03/22/15  1631 History  This chart was scribed for non-physician practitioner [name], NP, working with [name], MD by [name], ED Scribe. This patient was seen in room APFT20/APFT20 and the patient's care was started at 4:58 PM.      Chief Complaint  Patient presents with  . Hand Injury   Patient is a 59 y.o. female presenting with hand injury. The history is provided by the patient. No language interpreter was used.  Hand Injury Location:  Hand Time since incident:  30 minutes Injury: yes   Mechanism of injury: fall   Fall:    Fall occurred:  Walking   Impact surface: brick.   Point of impact:  Hands Hand location:  R hand Chronicity:  New Relieved by:  None tried  HPI Comments: Lindsey Howard is a 60 y.o. female who presents to the Emergency Department complaining of sudden onset right hand pain with swelling and ecchymoses secondary to a fall that occurred PTA Patient was taking out the trash when she slipped and fell onto brick. She has not tried anything for the pain. Patient denies injuries to any other areas.  Past Medical History  Diagnosis Date  . Sciatica   . Panic attacks   . Depression   . Chronic back pain   . Speech abnormality     chronic  . Chronic neck pain   . COPD (chronic obstructive pulmonary disease)   . GERD (gastroesophageal reflux disease)   . Esophagitis   . 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 Back surgery    . Neck surgery    . 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 Oophorectomy    . Nephrectomy Left 1978    s/p trauma   Family History  Problem Relation Age of Onset  . Heart failure Mother   . Stroke Father   . Seizures Father    Social History  Substance Use Topics  . Smoking status: Current Every Day Smoker -- 3.00 packs/day for 40 years    Types: Cigarettes   . Smokeless tobacco: Never Used  . Alcohol Use: Yes     Comment: occassionally   OB History    Gravida Para Term Preterm AB TAB SAB Ectopic Multiple Living   4 4 4       4      Review of Systems  Cardiovascular: Negative for chest pain.  Gastrointestinal: Negative for abdominal pain.  Musculoskeletal:       Right hand pain, swelling and bruising  Skin: Positive for color change.  all other systems negative    Allergies  Latex and Penicillins  Home Medications   Prior to Admission medications   Medication Sig Start Date End Date Taking? Authorizing Provider  albuterol (PROVENTIL HFA;VENTOLIN HFA) 108 (90 BASE) MCG/ACT inhaler Inhale 2 puffs into the lungs every 6 (six) hours as needed for wheezing.    Historical Provider, MD  Aspirin-Salicylamide-Caffeine (BC HEADACHE POWDER PO) Take 1 packet by mouth daily as needed. For pain    Historical Provider, MD  diazepam (VALIUM) 5 MG tablet Take 5 mg by mouth every 8 (eight) hours as needed (muscle relaxer). For spasms    Historical Provider, MD  doxycycline (VIBRAMYCIN) 100 MG capsule Take 1 capsule (100 mg total) by mouth 2 (two)  times daily. 12/20/14   [name], MD  HYDROcodone-acetaminophen (NORCO/VICODIN) 5-325 MG per tablet Take 1 tablet by mouth every 4 (four) hours as needed. 03/22/15   [name] [name], NP  naproxen (NAPROSYN) 500 MG tablet Take 1 tablet (500 mg total) by mouth 2 (two) times daily. 03/22/15   [name], NP  nitrofurantoin, macrocrystal-monohydrate, (MACROBID) 100 MG capsule Take 1 capsule (100 mg total) by mouth 2 (two) times daily. 01/29/13   [name], DO  ondansetron (ZOFRAN) 4 MG tablet Take 1 tablet (4 mg total) by mouth every 8 (eight) hours as needed for nausea. 01/29/13   [name], DO  sertraline (ZOLOFT) 50 MG tablet Take 50 mg by mouth daily.      Historical Provider, MD  zolpidem (AMBIEN) 10 MG tablet Take 10 mg by mouth at bedtime as needed for sleep.    Historical Provider, MD   BP 132/84  mmHg  Pulse 72  Temp(Src) 97.6 F (36.4 C) (Oral)  Resp 16  Ht 5\' 2"  (1.575 m)  Wt 119 lb (53.978 kg)  BMI 21.76 kg/m2  SpO2 100% Physical Exam  Constitutional: She is oriented to person, place, and time. She appears well-developed and well-nourished. No distress.  HENT:  Head: Normocephalic and atraumatic.  Eyes: Conjunctivae and EOM are normal.  Neck: Normal range of motion. Neck supple.  Cardiovascular: Normal rate.   Pulses:      Radial pulses are 2+ on the right side.  Adequate circulation.  Pulmonary/Chest: Effort normal.  Musculoskeletal: Normal range of motion.       Right hand: She exhibits tenderness and swelling. She exhibits normal capillary refill, no deformity and no laceration. Decreased range of motion: due to pain. Normal sensation noted. Normal strength noted.       Hands: Dorsum of right hand with swelling and ecchymoses that extends to the fingertips. Radial pulses 2+, adequate circulation.   Neurological: She is alert and oriented to person, place, and time. No cranial nerve deficit.  Skin: Skin is warm and dry.  Psychiatric: She has a normal mood and affect. Her behavior is normal.  Nursing note and vitals reviewed.   ED Course  Procedures  X-ray, ace wrap, ice, sling, pain management.  DIAGNOSTIC STUDIES: Oxygen Saturation is 98% on room air, normal by my interpretation.    COORDINATION OF CARE: 5:00 PM-Discussed treatment plan which includes pain medications and XR imaging with patient/guardian at bedside and patient/guardian agreed to plan.    Labs Review Labs Reviewed - No data to display  Imaging Review Dg Hand Complete Right  03/22/2015   CLINICAL DATA:  Swelling of the right hand after a fall  EXAM: RIGHT HAND - COMPLETE 3+ VIEW  COMPARISON:  None.  FINDINGS: The radiocarpal joint space appears normal. The carpal bones are in normal position. MCP, PIP, and DIP joints are within normal limits with only mild degenerative change of the DIP  joints. No fracture is seen and no erosion is noted.  IMPRESSION: No fracture.  Mild degenerative change of the DIP joints.   Electronically Signed   By: Ivar Drape M.D.   On: 03/22/2015 17:20   I personally reviewed and evaluated these images and lab results as part of my medical decision-making.   MDM  59 y.o. female with bruising, swelling and tenderness to the dorsum of the right hand s/p fall. Stable for d/c without neurovascular compromise. Ace wrap, ice, sling, pain mangement. And follow up with ortho or return here  as needed. Discussed with the patient clinical and xray findings and plan of care. All questioned fully answered. She will return if any problems arise.   Final diagnoses:  Contusion of right hand including fingers, initial encounter   I personally performed the services described in this documentation, which was scribed in my presence. The recorded information has been reviewed and is accurate.   [address] [place], [place] 03/22/15 1224  [name], MD 03/25/15 [phone]

## 2015-03-22 NOTE — Discharge Instructions (Signed)
Only wear the sling for a couple days. Wear the ace wrap as needed for comfort. Follow up with Dr. Luan Pulling or return here as needed.

## 2015-03-22 NOTE — ED Notes (Signed)
Patient states that she fell on some bricks at her house. Patient states that she fell on her right hand. Patient's hand appears red and swollen.

## 2015-06-16 ENCOUNTER — Other Ambulatory Visit (HOSPITAL_COMMUNITY): Payer: Self-pay | Admitting: Pulmonary Disease

## 2015-06-16 DIAGNOSIS — Z1231 Encounter for screening mammogram for malignant neoplasm of breast: Secondary | ICD-10-CM

## 2015-06-28 ENCOUNTER — Ambulatory Visit (HOSPITAL_COMMUNITY)
Admission: RE | Admit: 2015-06-28 | Discharge: 2015-06-28 | Disposition: A | Discharge status: Home / Self Care | Payer: Medicaid Other | Source: Ambulatory Visit | Attending: Pulmonary Disease | Admitting: Pulmonary Disease

## 2015-06-28 DIAGNOSIS — Z1231 Encounter for screening mammogram for malignant neoplasm of breast: Secondary | ICD-10-CM | POA: Diagnosis not present

## 2015-07-30 ENCOUNTER — Ambulatory Visit (INDEPENDENT_AMBULATORY_CARE_PROVIDER_SITE_OTHER): Payer: Medicaid Other | Admitting: Urology

## 2015-07-30 DIAGNOSIS — N3946 Mixed incontinence: Secondary | ICD-10-CM | POA: Diagnosis not present

## 2015-07-30 DIAGNOSIS — R3914 Feeling of incomplete bladder emptying: Secondary | ICD-10-CM

## 2015-10-01 ENCOUNTER — Ambulatory Visit: Payer: Self-pay | Admitting: Urology

## 2016-03-09 ENCOUNTER — Encounter: Payer: Self-pay | Admitting: Family Medicine

## 2016-03-23 LAB — FECAL OCCULT BLOOD, GUAIAC: Fecal Occult Blood: NEGATIVE

## 2016-03-27 ENCOUNTER — Observation Stay (HOSPITAL_COMMUNITY)
Admission: EM | Admit: 2016-03-27 | Discharge: 2016-03-29 | Disposition: A | Discharge status: Home / Self Care | Payer: Medicaid Other | Attending: Pulmonary Disease | Admitting: Pulmonary Disease

## 2016-03-27 ENCOUNTER — Emergency Department (HOSPITAL_COMMUNITY): Payer: Medicaid Other

## 2016-03-27 ENCOUNTER — Encounter (HOSPITAL_COMMUNITY): Payer: Self-pay | Admitting: Emergency Medicine

## 2016-03-27 DIAGNOSIS — F329 Major depressive disorder, single episode, unspecified: Secondary | ICD-10-CM | POA: Diagnosis not present

## 2016-03-27 DIAGNOSIS — Z79899 Other long term (current) drug therapy: Secondary | ICD-10-CM | POA: Insufficient documentation

## 2016-03-27 DIAGNOSIS — R531 Weakness: Secondary | ICD-10-CM | POA: Diagnosis not present

## 2016-03-27 DIAGNOSIS — Z8673 Personal history of transient ischemic attack (TIA), and cerebral infarction without residual deficits: Secondary | ICD-10-CM | POA: Insufficient documentation

## 2016-03-27 DIAGNOSIS — E440 Moderate protein-calorie malnutrition: Secondary | ICD-10-CM | POA: Insufficient documentation

## 2016-03-27 DIAGNOSIS — Z792 Long term (current) use of antibiotics: Secondary | ICD-10-CM | POA: Diagnosis not present

## 2016-03-27 DIAGNOSIS — R4781 Slurred speech: Secondary | ICD-10-CM | POA: Diagnosis not present

## 2016-03-27 DIAGNOSIS — I6300 Cerebral infarction due to thrombosis of unspecified precerebral artery: Secondary | ICD-10-CM

## 2016-03-27 DIAGNOSIS — I63349 Cerebral infarction due to thrombosis of unspecified cerebellar artery: Secondary | ICD-10-CM | POA: Diagnosis not present

## 2016-03-27 DIAGNOSIS — G9389 Other specified disorders of brain: Secondary | ICD-10-CM | POA: Diagnosis not present

## 2016-03-27 DIAGNOSIS — J449 Chronic obstructive pulmonary disease, unspecified: Secondary | ICD-10-CM | POA: Insufficient documentation

## 2016-03-27 DIAGNOSIS — F17210 Nicotine dependence, cigarettes, uncomplicated: Secondary | ICD-10-CM | POA: Insufficient documentation

## 2016-03-27 DIAGNOSIS — R4701 Aphasia: Secondary | ICD-10-CM | POA: Diagnosis present

## 2016-03-27 DIAGNOSIS — M549 Dorsalgia, unspecified: Secondary | ICD-10-CM | POA: Diagnosis not present

## 2016-03-27 DIAGNOSIS — F32A Depression, unspecified: Secondary | ICD-10-CM | POA: Diagnosis present

## 2016-03-27 DIAGNOSIS — Z7982 Long term (current) use of aspirin: Secondary | ICD-10-CM | POA: Diagnosis not present

## 2016-03-27 DIAGNOSIS — R4182 Altered mental status, unspecified: Secondary | ICD-10-CM | POA: Diagnosis present

## 2016-03-27 LAB — COMPREHENSIVE METABOLIC PANEL
ALBUMIN: 4 g/dL (ref 3.5–5.0)
ALK PHOS: 88 U/L (ref 38–126)
ALT: 14 U/L (ref 14–54)
ANION GAP: 7 (ref 5–15)
AST: 19 U/L (ref 15–41)
BUN: 10 mg/dL (ref 6–20)
CHLORIDE: 105 mmol/L (ref 101–111)
CO2: 27 mmol/L (ref 22–32)
Calcium: 9.2 mg/dL (ref 8.9–10.3)
Creatinine, Ser: 0.8 mg/dL (ref 0.44–1.00)
GFR calc non Af Amer: >60 mL/min (ref >60)
Glucose, Bld: 95 mg/dL (ref 65–99)
Potassium: 3.6 mmol/L (ref 3.5–5.1)
SODIUM: 139 mmol/L (ref 135–145)
Total Bilirubin: 0.4 mg/dL (ref 0.3–1.2)
Total Protein: 7.7 g/dL (ref 6.5–8.1)

## 2016-03-27 LAB — CBC
HCT: 46.7 % — ABNORMAL HIGH (ref 36.0–46.0)
Hemoglobin: 15.5 g/dL — ABNORMAL HIGH (ref 12.0–15.0)
MCH: 32.8 pg (ref 26.0–34.0)
MCHC: 33.2 g/dL (ref 30.0–36.0)
MCV: 98.7 fL (ref 78.0–100.0)
PLATELETS: 394 10*3/uL (ref 150–400)
RBC: 4.73 MIL/uL (ref 3.87–5.11)
RDW: 14.2 % (ref 11.5–15.5)
WBC: 16.2 10*3/uL — AB (ref 4.0–10.5)

## 2016-03-27 LAB — DIFFERENTIAL
BASOS PCT: 0 %
Basophils Absolute: 0 10*3/uL (ref 0.0–0.1)
Eosinophils Absolute: 0.1 10*3/uL (ref 0.0–0.7)
Eosinophils Relative: 1 %
LYMPHS PCT: 34 %
Lymphs Abs: 5.6 10*3/uL — ABNORMAL HIGH (ref 0.7–4.0)
MONO ABS: 1.4 10*3/uL — AB (ref 0.1–1.0)
Monocytes Relative: 8 %
NEUTROS ABS: 9.1 10*3/uL — AB (ref 1.7–7.7)
NEUTROS PCT: 57 %

## 2016-03-27 LAB — PROTIME-INR
INR: 1.03
PROTHROMBIN TIME: 13.5 s (ref 11.4–15.2)

## 2016-03-27 LAB — TROPONIN I

## 2016-03-27 LAB — APTT: aPTT: 29 seconds (ref 24–36)

## 2016-03-27 LAB — CBG MONITORING, ED: Glucose-Capillary: 90 mg/dL (ref 65–99)

## 2016-03-27 NOTE — ED Triage Notes (Signed)
Pt cannot speak in triage. Last known well was Friday when she spoke with daughter on phone.

## 2016-03-27 NOTE — ED Provider Notes (Signed)
[facility] DEPT Provider Note   CSN: [id number] Arrival date & time: 03/27/16  1935 By signing my name below, I, Georgette Shell, attest that this documentation has been prepared under the direction and in the presence of Milton Ferguson, MD. Electronically Signed: Georgette Shell, ED Scribe. 03/27/16. 9:09 PM.  History   Chief Complaint Chief Complaint  Patient presents with  . Aphasia   HPI Comments: Lindsey Howard is a 60 y.o. female who presents to the Emergency Department complaining of sudden onset aphasia beginning around 5-8:30 pm last night. Pt also has associated weakness. Per daughter, pt started slurring around 5-8:30 pm last night and gradually became worse. Pt's daughter was not there at onset. Daughter notes that she had h/o speech abnormalities but this is worse. Pt is ambulatory but daughter notes that her gait was slower and weaker. Pt's speech is slurred during exam. Pt also complains of sudden onset, 4/10, constant back pain. No alleviating factors tried PTA. Denies fever.   Patient states around 5:00 yesterday she started having problems with her speech   The history is provided by the patient. No language interpreter was used.  Altered Mental Status   This is a new problem. The current episode started 12 to 24 hours ago. The problem has been gradually improving. Pertinent negatives include no seizures and no hallucinations. Risk factors: Previous stroke. Her past medical history is significant for CVA.   Past Medical History:  Diagnosis Date  . 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 Chronic back pain   . Chronic neck pain   . COPD (chronic obstructive pulmonary disease) ([facility])   . Depression   . Esophagitis   . GERD (gastroesophageal reflux disease)   . Panic attacks   . Sciatica   . Speech abnormality    chronic  . Stroke [facility])     Patient Active Problem List   Diagnosis Date Noted  . Altered mental  status 06/29/2012  . Cerebral ventriculomegaly 06/29/2012  . Depression 06/29/2012    Past Surgical History:  Procedure Laterality Date  . BACK SURGERY    . NECK SURGERY    . NEPHRECTOMY Left 1978   s/p trauma  . OOPHORECTOMY    . SPLENECTOMY, TOTAL  1978   s/p trauma    OB History    Gravida Para Term Preterm AB Living   4 4 4     4    SAB TAB Ectopic Multiple Live Births                   Home Medications    Prior to Admission medications   Medication Sig Start Date End Date Taking? Authorizing Provider  albuterol (PROVENTIL HFA;VENTOLIN HFA) 108 (90 BASE) MCG/ACT inhaler Inhale 2 puffs into the lungs every 6 (six) hours as needed for wheezing.    Historical Provider, MD  Aspirin-Salicylamide-Caffeine (BC HEADACHE POWDER PO) Take 1 packet by mouth daily as needed. For pain    Historical Provider, MD  diazepam (VALIUM) 5 MG tablet Take 5 mg by mouth every 8 (eight) hours as needed (muscle relaxer). For spasms    Historical Provider, MD  doxycycline (VIBRAMYCIN) 100 MG capsule Take 1 capsule (100 mg total) by mouth 2 (two) times daily. 12/20/14   [name], MD  HYDROcodone-acetaminophen (NORCO/VICODIN) 5-325 MG per tablet Take 1 tablet by mouth every 4 (four) hours as needed. 03/22/15   Hope [name], NP  naproxen (NAPROSYN)  500 MG tablet Take 1 tablet (500 mg total) by mouth 2 (two) times daily. 03/22/15   [place], NP  nitrofurantoin, macrocrystal-monohydrate, (MACROBID) 100 MG capsule Take 1 capsule (100 mg total) by mouth 2 (two) times daily. 01/29/13   [name], DO  ondansetron (ZOFRAN) 4 MG tablet Take 1 tablet (4 mg total) by mouth every 8 (eight) hours as needed for nausea. 01/29/13   [name], DO  sertraline (ZOLOFT) 50 MG tablet Take 50 mg by mouth daily.      Historical Provider, MD  zolpidem (AMBIEN) 10 MG tablet Take 10 mg by mouth at bedtime as needed for sleep.    Historical Provider, MD    Family History Family History  Problem Relation Age of  Onset  . Heart failure Mother   . Stroke Father   . Seizures Father     Social History Social History  Substance Use Topics  . Smoking status: Current Every Day Smoker    Packs/day: 3.00    Years: 40.00    Types: Cigarettes  . Smokeless tobacco: Never Used  . Alcohol use Yes     Comment: occassionally     Allergies   Latex and Penicillins   Review of Systems Review of Systems  Constitutional: Negative for appetite change, fatigue and fever.  HENT: Negative for congestion, ear discharge and sinus pressure.   Eyes: Negative for discharge.  Respiratory: Negative for cough.   Cardiovascular: Negative for chest pain.  Gastrointestinal: Negative for abdominal pain and diarrhea.  Genitourinary: Negative for frequency and hematuria.  Musculoskeletal: Positive for back pain.  Skin: Negative for rash.  Neurological: Positive for speech difficulty. Negative for seizures and headaches.  Psychiatric/Behavioral: Negative for hallucinations.   Physical Exam Updated Vital Signs BP 164/99 (BP Location: Left Arm)   Pulse 80   Temp 98 F (36.7 C) (Oral)   Resp 20   Ht 5\' 4"  (1.626 m)   Wt 124 lb (56.2 kg)   SpO2 100%   BMI 21.28 kg/m   Physical Exam  Constitutional: She is oriented to person, place, and time. She appears well-developed.  HENT:  Head: Normocephalic.  Eyes: Conjunctivae and EOM are normal. No scleral icterus.  Neck: Neck supple. No thyromegaly present.  Cardiovascular: Normal rate and regular rhythm.  Exam reveals no gallop and no friction rub.   No murmur heard. Pulmonary/Chest: No stridor. She has no wheezes. She has no rales. She exhibits no tenderness.  Abdominal: She exhibits no distension. There is no tenderness. There is no rebound.  Musculoskeletal: Normal range of motion. She exhibits no edema.  Mildly weak in both lower extremities.  Lymphadenopathy:    She has no cervical adenopathy.  Neurological: She is oriented to person, place, and time. She  exhibits normal muscle tone. Coordination normal.  Slow speech, mildly slurred.  Skin: No rash noted. No erythema.  Psychiatric: She has a normal mood and affect. Her behavior is normal.     ED Treatments / Results  DIAGNOSTIC STUDIES: Oxygen Saturation is 100% on RA, normal by my interpretation.    COORDINATION OF CARE: 9:09 PM Discussed treatment plan with pt at bedside and pt agreed to plan.  Labs (all labs ordered are listed, but only abnormal results are displayed) Labs Reviewed  PROTIME-INR  APTT  CBC  DIFFERENTIAL  COMPREHENSIVE METABOLIC PANEL  TROPONIN I  CBG MONITORING, ED    EKG  EKG Interpretation None       Radiology Ct Head Wo  Contrast  Result Date: 03/27/2016 CLINICAL DATA:  Unable to speak starting yesterday. No known injury. EXAM: CT HEAD WITHOUT CONTRAST TECHNIQUE: Contiguous axial images were obtained from the base of the skull through the vertex without intravenous contrast. COMPARISON:  CT head dated 12/20/2014. Also CT head dated 01/29/2013. FINDINGS: Brain: The ventriculomegaly, involving the lateral ventricles and third ventricle, mild to moderate in degree, appears stable. All areas of the brain demonstrate normal gray-white matter attenuation. There is no mass, hemorrhage, edema or other evidence of acute parenchymal abnormality. No mass effect, midline shift or herniation. No extra-axial hemorrhage. Vascular: There are chronic calcified atherosclerotic changes of the large vessels at the skull base. Skull: Osseous structures are unremarkable. Sinuses/Orbits: Visualized upper paranasal sinuses are clear. Mastoid air cells are clear. Periorbital and retro-orbital soft tissues are unremarkable. Other: None IMPRESSION: 1. No acute findings.  No intracranial mass, hemorrhage or edema. 2. Stable chronic ventriculomegaly. Electronically Signed   By: Franki Cabot M.D.   On: 03/27/2016 20:28    Procedures Procedures (including critical care  time)  Medications Ordered in ED Medications - No data to display   Initial Impression / Assessment and Plan / ED Course  I have reviewed the triage vital signs and the nursing notes.  Pertinent labs & imaging results that were available during my care of the patient were reviewed by me and considered in my medical decision making (see chart for details).  Clinical Course  Patient will be admitted for stroke workup for difficulty speaking    Final Clinical Impressions(s) / ED Diagnoses   Final diagnoses:  None    New Prescriptions New Prescriptions   No medications on file  The chart was scribed for me under my direct supervision.  I personally performed the history, physical, and medical decision making and all procedures in the evaluation of this patient.Milton Ferguson, MD 03/27/16 2251

## 2016-03-27 NOTE — ED Notes (Signed)
Time on initial assessment should be 2120.
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
## 2016-03-28 ENCOUNTER — Observation Stay (HOSPITAL_COMMUNITY): Payer: Medicaid Other

## 2016-03-28 ENCOUNTER — Encounter (HOSPITAL_COMMUNITY): Payer: Self-pay

## 2016-03-28 DIAGNOSIS — F17210 Nicotine dependence, cigarettes, uncomplicated: Secondary | ICD-10-CM | POA: Diagnosis not present

## 2016-03-28 DIAGNOSIS — I63349 Cerebral infarction due to thrombosis of unspecified cerebellar artery: Secondary | ICD-10-CM | POA: Diagnosis not present

## 2016-03-28 DIAGNOSIS — J449 Chronic obstructive pulmonary disease, unspecified: Secondary | ICD-10-CM | POA: Diagnosis not present

## 2016-03-28 DIAGNOSIS — M549 Dorsalgia, unspecified: Secondary | ICD-10-CM | POA: Diagnosis not present

## 2016-03-28 LAB — TROPONIN I
Troponin I: <0.03 ng/mL (ref <0.03)
Troponin I: <0.03 ng/mL (ref <0.03)

## 2016-03-28 LAB — URINALYSIS, ROUTINE W REFLEX MICROSCOPIC
BILIRUBIN URINE: NEGATIVE
GLUCOSE, UA: NEGATIVE mg/dL
Ketones, ur: NEGATIVE mg/dL
Leukocytes, UA: NEGATIVE
Nitrite: NEGATIVE
PH: 5.5 (ref 5.0–8.0)
Protein, ur: NEGATIVE mg/dL

## 2016-03-28 LAB — URINE MICROSCOPIC-ADD ON

## 2016-03-28 LAB — TSH: TSH: 1.451 u[IU]/mL (ref 0.350–4.500)

## 2016-03-28 MED ORDER — NICOTINE 14 MG/24HR TD PT24
14.0000 mg | MEDICATED_PATCH | Freq: Every day | TRANSDERMAL | Status: DC
  Administered 2016-03-28 – 2016-03-29 (×2): 14 mg via TRANSDERMAL
  Filled 2016-03-28 (×2): qty 1

## 2016-03-28 MED ORDER — HYDROCODONE-ACETAMINOPHEN 5-325 MG PO TABS
1.0000 | ORAL_TABLET | Freq: Four times a day (QID) | ORAL | Status: DC | PRN
  Administered 2016-03-28 – 2016-03-29 (×2): 1 via ORAL
  Filled 2016-03-28 (×2): qty 1

## 2016-03-28 MED ORDER — DIAZEPAM 5 MG PO TABS
5.0000 mg | ORAL_TABLET | Freq: Three times a day (TID) | ORAL | Status: DC | PRN
  Administered 2016-03-28: 5 mg via ORAL
  Filled 2016-03-28: qty 1

## 2016-03-28 MED ORDER — ALBUTEROL SULFATE (2.5 MG/3ML) 0.083% IN NEBU: 3.0000 mL | INHALATION_SOLUTION | Freq: Four times a day (QID) | RESPIRATORY_TRACT | Status: DC | PRN

## 2016-03-28 MED ORDER — ASPIRIN 81 MG PO CHEW
162.0000 mg | CHEWABLE_TABLET | Freq: Every day | ORAL | Status: DC
  Administered 2016-03-28 – 2016-03-29 (×2): 162 mg via ORAL
  Filled 2016-03-28 (×2): qty 2

## 2016-03-28 MED ORDER — ENOXAPARIN SODIUM 40 MG/0.4ML ~~LOC~~ SOLN
40.0000 mg | SUBCUTANEOUS | Status: DC
  Administered 2016-03-28: 40 mg via SUBCUTANEOUS
  Filled 2016-03-28 (×2): qty 0.4

## 2016-03-28 MED ORDER — ACETAMINOPHEN 325 MG PO TABS
650.0000 mg | ORAL_TABLET | Freq: Four times a day (QID) | ORAL | Status: DC | PRN
  Administered 2016-03-28 – 2016-03-29 (×3): 650 mg via ORAL
  Filled 2016-03-28 (×4): qty 2

## 2016-03-28 MED ORDER — SODIUM CHLORIDE 0.9% FLUSH
3.0000 mL | Freq: Two times a day (BID) | INTRAVENOUS | Status: DC
  Administered 2016-03-28 – 2016-03-29 (×4): 3 mL via INTRAVENOUS

## 2016-03-28 NOTE — Consult Note (Signed)
Lindsey A. [name], MD     [email or web address]          Lindsey Howard is an 60 y.o. female.   ASSESSMENT/PLAN: 1. Worsening of patient's baseline dysarthria and left-sided weakness. Etiology unclear. Imaging has been negative for acute infarct. I suspect that the patient may have undetermined metabolic issues to explain the worsening symptoms. 2. 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  3. 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   RECOMMENDATION: Urinalysis and reflex testing. EEG. Additional labs for the following: RPR, vitamin B12 level, and HIV. Aspirin 81 mg 2 tablets a day for secondary stroke prevention.        The patient is a 60 year old white female who has a baseline history of marked dysarthria and left hemiparesis. It appears that she developed the acute onset of these symptoms in the 1990s. The history obtained from the patient's daughter who is 48 years old when she discovered her mother having acute onset of left-sided weakness and dysarthria. She was taken to the hospital and since then she's had residual problems. Her symptoms improve although the daughter reports that a few years after she has had some worsening dysarthria issues which may have been progressive. The patient herself states that the left leg has been significantly weak resulting in gait impairment since her stroke. The patient seemed to have a history of the hydrocephalus discovered on imaging. She has seen neurosurgery in the past and was told that VP shunt and was not indicated at this time. The patient indicates that she has had significant issues with the cognitive impairment in school. She was in special education. It appears that on Sunday night the patient developed the acute onset of significantly worsening gait impairment and dysarthria. The gait impairment was mostly due to leg weakness  bilaterally. The daughter noticed mostly that the patient's dysarthria at baseline got significantly worse. The patient was taken to the hospital for further evaluation. The symptom lasted for over 24 hours. They've indicated that the patient's symptoms have returned to baseline. The patient does not report having numbness per se. She did have some chest pain today in the hospital. She was given medications for this. Interestingly, she reports that the medication causes her legs tingle for little while after she got them. She denies shortness of breath. She reports having a long history of headaches which are apparently there almost all the time. The review systems otherwise negative.    GENERAL: Pleasant female in no acute distress.  HEENT: Poor dentition. She has a class II malocclusion. No dysmorphic features.  ABDOMEN: soft  EXTREMITIES: No edema   BACK: Normal  SKIN: Normal by inspection.    MENTAL STATUS: She is awake and alert. She has a moderate dysarthria which apparently is her baseline. She is lucid and coherent.  CRANIAL NERVES: Pupils are equal, round and reactive to light and accomodation; extra ocular movements are full, there is no significant nystagmus; visual fields are full; upper and lower facial muscles are normal in strength and symmetric, there is no flattening of the nasolabial folds; tongue is midline; uvula is midline; shoulder elevation is normal.  MOTOR: Normal tone, bulk and strength; no pronator drift.  COORDINATION: Left finger to nose is normal, right finger to nose is normal, No rest tremor; no intention tremor; no postural tremor; no bradykinesia.  REFLEXES: Deep tendon reflexes are brisk in the left arm and left leg but normal  on the right side. Babinski reflexes are flexor bilaterally.   SENSATION: Normal to light touch, temperature, and pinprick.       The brain MRI is reviewed in person. There is marked ventriculomegaly of the lateral ventricles.  There is no evidence of transependymal edema. The third ventricle is markedly increased. No acute signal is seen on diffusion imaging. There is no white matter changes. No clear infarcts are seen that chronic.   Blood pressure (!) 150/86, pulse 64, temperature 98.3 F (36.8 C), resp. rate 18, height 5' 2"  (1.575 m), weight 119 lb 12.8 oz (54.3 kg), SpO2 100 %.  Past Medical History:  Diagnosis Date  . 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 Chronic back pain   . Chronic neck pain   . COPD (chronic obstructive pulmonary disease) ([place])   . Depression   . Esophagitis   . GERD (gastroesophageal reflux disease)   . Panic attacks   . Sciatica   . Speech abnormality    chronic  . Stroke [facility])     Past Surgical History:  Procedure Laterality Date  . BACK SURGERY    . NECK SURGERY    . NEPHRECTOMY Left 1978   s/p trauma  . OOPHORECTOMY    . SPLENECTOMY, TOTAL  1978   s/p trauma    Family History  Problem Relation Age of Onset  . Heart failure Mother   . Stroke Father   . Seizures Father     Social History:  reports that she has been smoking Cigarettes.  She has a 120.00 pack-year smoking history. She has never used smokeless tobacco. She reports that she drinks alcohol. She reports that she does not use drugs.  Allergies:  Allergies  Allergen Reactions  . Latex Other (See Comments)    Burns,irritates skin  . Penicillins Swelling    Has patient had a PCN reaction causing immediate rash, facial/tongue/throat swelling, SOB or lightheadedness with hypotension: No Has patient had a PCN reaction causing severe rash involving mucus membranes or skin necrosis: No Has patient had a PCN reaction that required hospitalization No Has patient had a PCN reaction occurring within the last 10 years: No If all of the above answers are "NO", then may proceed with Cephalosporin use.     Medications: Prior to Admission  medications   Medication Sig Start Date End Date Taking? Authorizing Provider  Aspirin-Salicylamide-Caffeine (BC HEADACHE POWDER PO) Take 1 packet by mouth daily as needed. For pain   Yes Historical Provider, MD  busPIRone (BUSPAR) 10 MG tablet Take 10 mg by mouth 3 (three) times daily.   Yes Historical Provider, MD  diazepam (VALIUM) 5 MG tablet Take 5 mg by mouth every 8 (eight) hours as needed (muscle relaxer). For spasms   Yes Historical Provider, MD  HYDROcodone-acetaminophen (NORCO/VICODIN) 5-325 MG per tablet Take 1 tablet by mouth every 4 (four) hours as needed. 03/22/15  Yes Hope [name], NP  naproxen (NAPROSYN) 500 MG tablet Take 1 tablet (500 mg total) by mouth 2 (two) times daily. 03/22/15  Yes Hope [name], NP  traZODone (DESYREL) 50 MG tablet Take 50 mg by mouth at bedtime.   Yes Historical Provider, MD    Scheduled Meds: . enoxaparin (LOVENOX) injection  40 mg Subcutaneous Q24H  . nicotine  14 mg Transdermal Daily  . sodium chloride flush  3 mL Intravenous Q12H   Continuous Infusions:  PRN Meds:.acetaminophen, albuterol, diazepam, HYDROcodone-acetaminophen  Results for orders placed or performed during the hospital encounter of 03/27/16 (from the past 48 hour(s))  Protime-INR     Status: None   Collection Time: 03/27/16  9:30 PM  Result Value Ref Range   Prothrombin Time 13.5 11.4 - 15.2 seconds   INR 1.03   APTT     Status: None   Collection Time: 03/27/16  9:30 PM  Result Value Ref Range   aPTT 29 24 - 36 seconds  CBC     Status: Abnormal   Collection Time: 03/27/16  9:30 PM  Result Value Ref Range   WBC 16.2 (H) 4.0 - 10.5 K/uL   RBC 4.73 3.87 - 5.11 MIL/uL   Hemoglobin 15.5 (H) 12.0 - 15.0 g/dL   HCT 46.7 (H) 36.0 - 46.0 %   MCV 98.7 78.0 - 100.0 fL   MCH 32.8 26.0 - 34.0 pg   MCHC 33.2 30.0 - 36.0 g/dL   RDW 14.2 11.5 - 15.5 %   Platelets 394 150 - 400 K/uL  Differential     Status: Abnormal   Collection Time: 03/27/16  9:30 PM  Result Value Ref Range    Neutrophils Relative % 57 %   Neutro Abs 9.1 (H) 1.7 - 7.7 K/uL   Lymphocytes Relative 34 %   Lymphs Abs 5.6 (H) 0.7 - 4.0 K/uL   Monocytes Relative 8 %   Monocytes Absolute 1.4 (H) 0.1 - 1.0 K/uL   Eosinophils Relative 1 %   Eosinophils Absolute 0.1 0.0 - 0.7 K/uL   Basophils Relative 0 %   Basophils Absolute 0.0 0.0 - 0.1 K/uL  Comprehensive metabolic panel     Status: None   Collection Time: 03/27/16  9:30 PM  Result Value Ref Range   Sodium 139 135 - 145 mmol/L   Potassium 3.6 3.5 - 5.1 mmol/L   Chloride 105 101 - 111 mmol/L   CO2 27 22 - 32 mmol/L   Glucose, Bld 95 65 - 99 mg/dL   BUN 10 6 - 20 mg/dL   Creatinine, Ser 0.80 0.44 - 1.00 mg/dL   Calcium 9.2 8.9 - 10.3 mg/dL   Total Protein 7.7 6.5 - 8.1 g/dL   Albumin 4.0 3.5 - 5.0 g/dL   AST 19 15 - 41 U/L   ALT 14 14 - 54 U/L   Alkaline Phosphatase 88 38 - 126 U/L   Total Bilirubin 0.4 0.3 - 1.2 mg/dL   GFR calc non Af Amer >60 >60 mL/min   GFR calc Af Amer >60 >60 mL/min    Comment: (NOTE) The eGFR has been calculated using the CKD EPI equation. This calculation has not been validated in all clinical situations. eGFR's persistently <60 mL/min signify possible Chronic Kidney Disease.    Anion gap 7 5 - 15  Troponin I     Status: None   Collection Time: 03/27/16  9:30 PM  Result Value Ref Range   Troponin I <0.03 <0.03 ng/mL  TSH     Status: None   Collection Time: 03/27/16  9:50 PM  Result Value Ref Range   TSH 1.451 0.350 - 4.500 uIU/mL  CBG monitoring, ED     Status: None   Collection Time: 03/27/16 10:31 PM  Result Value Ref Range   Glucose-Capillary 90 65 - 99 mg/dL  Troponin I (q 6hr x 3)     Status: None   Collection Time: 03/28/16  3:42 PM  Result Value Ref Range   Troponin I <0.03 <0.03  ng/mL    Studies/Results:   BRAIN MRI Chronic ventriculomegaly is stable. As before, favor congenital aqueductal stenosis. Major intracranial vascular flow voids are stable since 2013. No restricted diffusion  or evidence of acute infarction.  Stable cerebral volume. No midline shift, mass effect, evidence of mass lesion, extra-axial collection or acute intracranial hemorrhage. Cervicomedullary junction and pituitary are within normal limits. [place] Cables and white matter signal appears stable since 2013. There is questionable mild cortical encephalomalacia on the basis of mild increased FLAIR signal in the left peri-Rolandic cortex near the vertex (series 6, image 20). Elsewhere gray and white matter signal appears normal for age. No chronic cerebral blood products.  Visualized paranasal sinuses and mastoids are stable and well pneumatized. Negative orbit and scalp soft tissues. Stable visualized cervical spine, C4 ACDF hardware artifact partially visible. Visualized bone marrow signal is within normal limits.  IMPRESSION: 1.  No acute intracranial abnormality. 2. Stable noncontrast MRI appearance the brain since 2013. 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    HEAD CT 1. No acute findings.  No intracranial mass, hemorrhage or edema. 2. Stable chronic ventriculomegaly.        Renata Gambino A. Merlene Howard, M.D.  Diplomate, Tax adviser of Psychiatry and Neurology ( Neurology). 03/28/2016, 5:55 PM

## 2016-03-28 NOTE — Care Management Note (Signed)
Case Management Note  Patient Details  Name: Lindsey Howard MRN: [id number] Date of Birth: Dec 01, 1955  Subjective/Objective: Patient is from home with spouse. She is ind with ADL's, uses a cane to walk at times. Has hx of  stroke and would like Home health. She has a PCP and insurance, reports no issues.                    Action/Plan: Offered choice. Romualdo Bolk of [facility] notified and will obtain orders from chart. Patient aware [facility] has 48 to make first visit.    Expected Discharge Date:                  Expected Discharge Plan:  Home/Self Care  In-House Referral:  NA  Discharge planning Services  CM Consult  Post Acute Care Choice:    Choice offered to:  Patient  DME Arranged:    DME Agency:     HH Arranged:  RN [place] Agency:  [place]  Status of Service:  In process, will continue to follow  If discussed at Long Length of Stay Meetings, dates discussed:    Additional Comments:  [address], Chauncey [place], RN 03/28/2016, 1:42 PM

## 2016-03-28 NOTE — Evaluation (Signed)
Clinical/Bedside Swallow Evaluation Patient Details  Name: Lindsey Howard MRN: [id number] Date of Birth: [birth year]  Today's Date: 03/28/2016 Time: SLP Start Time (ACUTE ONLY): 61 SLP Stop Time (ACUTE ONLY): 1643 SLP Time Calculation (min) (ACUTE ONLY): 33 min  Past Medical History:  Past Medical History:  Diagnosis Date  . 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 Chronic back pain   . Chronic neck pain   . COPD (chronic obstructive pulmonary disease) ([place])   . Depression   . Esophagitis   . GERD (gastroesophageal reflux disease)   . Panic attacks   . Sciatica   . Speech abnormality    chronic  . Stroke [facility])    Past Surgical History:  Past Surgical History:  Procedure Laterality Date  . BACK SURGERY    . NECK SURGERY    . NEPHRECTOMY Left 1978   s/p trauma  . OOPHORECTOMY    . SPLENECTOMY, TOTAL  1978   s/p trauma   HPI:  Lindsey Howard is a 60 y.o. female with medical history significant of COPD, cerebral ventriculomegaly, depression, and encephalopathy, presents to the ED with complaints of slurred and stuttered speech for 1 day.  She has associated bilateral LE weakness that onset around 5-8:30 pm last night. Per daughters,  her speech has not been normal since her prior CVA in 2013, but it has became progressively worse since last night. Pt also admits to a sudden onset and constant back pain. Pt admits to taking valium one time in the morning and one time at night for anxiety, and has new medicaitions including buspar.  In the ER, when I saw her, she has intermittent  Fluent speech, stuttered speech, but no true aphasia noted.    Assessment / Plan / Recommendation Clinical Impression  Pt seen at bedside for clinical swallow evaluation with daughter in room. Pt reports history of "neck surgery" (C4-C6 ACDF), but denies post operative dysphagia. Pt presents with mild dysarthria, however pt and daughter report  this is close to baseline. Pt with history of stroke in 1990s (again denies dysphagia). Pt also reports 60 pound intentional weight loss "a couple of years ago". She reports difficulty swallowing some meats, breads, and pills and points to sternal notch as source of globus. Pt shows no overt signs or symptoms of aspiration during today's evaluation, however she is very intentional when she swallows. Pt may benefit from esophageal assessment as an outpatient if symptoms persist (barium swallow or could do MBSS with esophageal sweep). Pt will be placed on a regular diet with thin liquids with use of compensatory strategies (cut up foods well and alternate solids/liquids to assist with bolus clearance as needed). Pt in agreement with plan. No further SLP follow up indicated at this time.    Aspiration Risk  Mild aspiration risk    Diet Recommendation Regular;Thin liquid   Liquid Administration via: Cup;Straw Medication Administration: Crushed with puree (Pt states she lets them dissolve in mouth before swallowing) Supervision: Patient able to self feed;Intermittent supervision to cue for compensatory strategies Compensations: Slow rate;Small sips/bites Postural Changes: Seated upright at 90 degrees;Remain upright for at least 30 minutes after po intake    Other  Recommendations Recommended Consults: Consider GI evaluation;Consider esophageal assessment Oral Care Recommendations: Oral care BID Other Recommendations: Clarify dietary restrictions   Follow up Recommendations  None    Frequency and Duration  Prognosis Prognosis for Safe Diet Advancement: Good      Swallow Study   General Date of Onset: 03/27/16 HPI: Lindsey Howard is a 60 y.o. female with medical history significant of COPD, cerebral ventriculomegaly, depression, and encephalopathy, presents to the ED with complaints of slurred and stuttered speech for 1 day.  She has associated bilateral LE weakness that onset  around 5-8:30 pm last night. Per daughters,  her speech has not been normal since her prior CVA in 2013, but it has became progressively worse since last night. Pt also admits to a sudden onset and constant back pain. Pt admits to taking valium one time in the morning and one time at night for anxiety, and has new medicaitions including buspar.  In the ER, when I saw her, she has intermittent  Fluent speech, stuttered speech, but no true aphasia noted.  Type of Study: Bedside Swallow Evaluation Previous Swallow Assessment: N/A Diet Prior to this Study: NPO Temperature Spikes Noted: No Respiratory Status: Room air History of Recent Intubation: No Behavior/Cognition: Alert;Cooperative;Pleasant mood Oral Cavity Assessment: Within Functional Limits Oral Care Completed by SLP: No Oral Cavity - Dentition: Poor condition Vision: Functional for self-feeding Self-Feeding Abilities: Able to feed self Patient Positioning: Upright in bed Baseline Vocal Quality: Normal (mild dysarthria) Volitional Cough: Strong Volitional Swallow: Able to elicit    Oral/Motor/Sensory Function Overall Oral Motor/Sensory Function: Within functional limits   Ice Chips Ice chips: Within functional limits Presentation: Spoon   Thin Liquid Thin Liquid: Within functional limits Presentation: Cup;Spoon;Straw;Self Fed    Nectar Thick Nectar Thick Liquid: Not tested   Honey Thick Honey Thick Liquid: Not tested   Puree Puree: Within functional limits Presentation: Spoon;Self Fed   Solid   GO   Solid: Within functional limits Presentation: Self Fed    Functional Assessment Tool Used: clinical judgment Functional Limitations: Swallowing Swallow Current Status [id number] At least 1 percent but less than 20 percent impaired, limited or restricted Swallow Goal Status [id number]): At least 1 percent but less than 20 percent impaired, limited or restricted Swallow Discharge Status [phone]): At least 1 percent but less than 20  percent impaired, limited or restricted  Thank you,  Genene Churn, [place] 03/28/2016,4:48 PM
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
## 2016-03-28 NOTE — Progress Notes (Addendum)
   03/28/16 1507  Vitals  BP (!) 150/86  Pulse Rate 64  Resp 18  Oxygen Therapy  SpO2 100 %  Pt c/o achy chest pain that does not radiate. Pt stated has had these pains before.  VS as noted above.  Verbally notified Dr Luan Pulling at the office.  Dr Luan Pulling gave verbal order for EKG and troponin labs X 3.  Pt also complained at this time of leg/ankle cramping.  Valium PRN given.  Will continue to monitor.
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
## 2016-03-29 ENCOUNTER — Other Ambulatory Visit (HOSPITAL_COMMUNITY): Payer: Self-pay

## 2016-03-29 DIAGNOSIS — E440 Moderate protein-calorie malnutrition: Secondary | ICD-10-CM | POA: Insufficient documentation

## 2016-03-29 LAB — TROPONIN I

## 2016-03-29 LAB — VITAMIN B12: Vitamin B-12: 296 pg/mL (ref 180–914)

## 2016-03-29 MED ORDER — ASPIRIN 81 MG PO CHEW: 162.0000 mg | CHEWABLE_TABLET | Freq: Every day | ORAL | 12 refills | Status: DC

## 2016-03-29 MED ORDER — AMLODIPINE BESYLATE 5 MG PO TABS: 5.0000 mg | ORAL_TABLET | Freq: Every day | ORAL | 12 refills | Status: DC

## 2016-03-29 MED ORDER — NICOTINE 14 MG/24HR TD PT24: 14.0000 mg | MEDICATED_PATCH | Freq: Every day | TRANSDERMAL | 0 refills | Status: DC

## 2016-03-29 NOTE — Progress Notes (Signed)
Patient discharged home with family. Patient IV removed and site intact.  Reviewed patient discharge papers and scheduled outpatient EEG per Dr. Luan Pulling.
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
## 2016-03-30 LAB — HOMOCYSTEINE: HOMOCYSTEINE-NORM: 11 umol/L (ref 0.0–15.0)

## 2016-03-30 LAB — HIV ANTIBODY (ROUTINE TESTING W REFLEX): HIV Screen 4th Generation wRfx: NONREACTIVE

## 2016-03-30 LAB — RPR: RPR: NONREACTIVE

## 2016-04-06 ENCOUNTER — Ambulatory Visit (HOSPITAL_COMMUNITY)
Admit: 2016-04-06 | Discharge: 2016-04-06 | Disposition: A | Discharge status: Home / Self Care | Payer: Medicaid Other | Attending: Neurology | Admitting: Neurology

## 2016-04-06 DIAGNOSIS — R9401 Abnormal electroencephalogram [EEG]: Secondary | ICD-10-CM | POA: Insufficient documentation

## 2016-04-06 DIAGNOSIS — R410 Disorientation, unspecified: Secondary | ICD-10-CM | POA: Insufficient documentation

## 2016-04-06 DIAGNOSIS — Z7982 Long term (current) use of aspirin: Secondary | ICD-10-CM | POA: Insufficient documentation

## 2016-04-06 DIAGNOSIS — Z79899 Other long term (current) drug therapy: Secondary | ICD-10-CM | POA: Insufficient documentation

## 2016-04-06 DIAGNOSIS — R471 Dysarthria and anarthria: Secondary | ICD-10-CM | POA: Insufficient documentation

## 2016-04-08 NOTE — Procedures (Signed)
  Lindsey Howard A. [name], MD     [email or web address]           HISTORY: The patient is a 60 year old female who presents with episodes of dysarthria and focal neurological symptoms of weakness/hemiparesis. They also appear to be associated with some confusion. The spells are suspicious for seizures.  MEDICATIONS: Scheduled Meds: Continuous Infusions: PRN Meds:.  Prior to Admission medications   Medication Sig Start Date End Date Taking? Authorizing Provider  amLODipine (NORVASC) 5 MG tablet Take 1 tablet (5 mg total) by mouth daily. 03/29/16   Sinda Du, MD  aspirin 81 MG chewable tablet Chew 2 tablets (162 mg total) by mouth daily. 03/29/16   Sinda Du, MD  Aspirin-Salicylamide-Caffeine (BC HEADACHE POWDER PO) Take 1 packet by mouth daily as needed. For pain    Historical Provider, MD  busPIRone (BUSPAR) 10 MG tablet Take 10 mg by mouth 3 (three) times daily.    Historical Provider, MD  diazepam (VALIUM) 5 MG tablet Take 5 mg by mouth every 8 (eight) hours as needed (muscle relaxer). For spasms    Historical Provider, MD  HYDROcodone-acetaminophen (NORCO/VICODIN) 5-325 MG per tablet Take 1 tablet by mouth every 4 (four) hours as needed. 03/22/15   Hope [name], NP  naproxen (NAPROSYN) 500 MG tablet Take 1 tablet (500 mg total) by mouth 2 (two) times daily. 03/22/15   Willits, NP  nicotine (NICODERM CQ - DOSED IN MG/24 HOURS) 14 mg/24hr patch Place 1 patch (14 mg total) onto the skin daily. 03/29/16   Sinda Du, MD  traZODone (DESYREL) 50 MG tablet Take 50 mg by mouth at bedtime.    Historical Provider, MD      ANALYSIS: A 16 channel recording using standard 10 20 measurements is conducted for 23 minutes. There is a posterior dominant rhythm of 10 Hz which attenuates with eye opening. There is beta activity observed the frontal areas. Awake and sleep architecture is observed. She spindles is seen throughout the recording. Photic stimulation and  hyperventilation are not conducted. The patient is noted to have several episodes of sharp wave activity that phase reverses at T3. Sometimes the spread to the contralateral side. There is no focal or lateral slowing.   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      Evalisse Prajapati A. [name], M.D.  Diplomate, Tax adviser of Psychiatry and Neurology ( Neurology).

## 2016-05-24 ENCOUNTER — Other Ambulatory Visit (HOSPITAL_COMMUNITY): Payer: Self-pay | Admitting: Pulmonary Disease

## 2016-05-24 DIAGNOSIS — Z1231 Encounter for screening mammogram for malignant neoplasm of breast: Secondary | ICD-10-CM

## 2016-07-03 ENCOUNTER — Ambulatory Visit (HOSPITAL_COMMUNITY)
Admission: RE | Admit: 2016-07-03 | Discharge: 2016-07-03 | Disposition: A | Discharge status: Home / Self Care | Payer: Medicaid Other | Source: Ambulatory Visit | Attending: Pulmonary Disease | Admitting: Pulmonary Disease

## 2016-07-03 DIAGNOSIS — Z1231 Encounter for screening mammogram for malignant neoplasm of breast: Secondary | ICD-10-CM | POA: Insufficient documentation

## 2016-07-03 LAB — HM MAMMOGRAPHY

## 2016-09-28 ENCOUNTER — Encounter: Payer: Self-pay | Admitting: Family Medicine

## 2016-09-28 NOTE — Telephone Encounter (Signed)
Error

## 2017-03-05 ENCOUNTER — Emergency Department (HOSPITAL_COMMUNITY): Payer: Medicaid Other

## 2017-03-05 ENCOUNTER — Emergency Department (HOSPITAL_COMMUNITY)
Admission: EM | Admit: 2017-03-05 | Discharge: 2017-03-05 | Disposition: A | Discharge status: Home / Self Care | Payer: Medicaid Other | Attending: Emergency Medicine | Admitting: Emergency Medicine

## 2017-03-05 ENCOUNTER — Encounter (HOSPITAL_COMMUNITY): Payer: Self-pay | Admitting: Adult Health

## 2017-03-05 DIAGNOSIS — H449 Unspecified disorder of globe: Secondary | ICD-10-CM | POA: Diagnosis not present

## 2017-03-05 DIAGNOSIS — Z91040 Latex allergy status: Secondary | ICD-10-CM | POA: Diagnosis not present

## 2017-03-05 DIAGNOSIS — F17210 Nicotine dependence, cigarettes, uncomplicated: Secondary | ICD-10-CM | POA: Diagnosis not present

## 2017-03-05 DIAGNOSIS — I10 Essential (primary) hypertension: Secondary | ICD-10-CM

## 2017-03-05 DIAGNOSIS — R079 Chest pain, unspecified: Secondary | ICD-10-CM | POA: Insufficient documentation

## 2017-03-05 DIAGNOSIS — R197 Diarrhea, unspecified: Secondary | ICD-10-CM | POA: Diagnosis not present

## 2017-03-05 DIAGNOSIS — R05 Cough: Secondary | ICD-10-CM | POA: Insufficient documentation

## 2017-03-05 DIAGNOSIS — R112 Nausea with vomiting, unspecified: Secondary | ICD-10-CM | POA: Diagnosis not present

## 2017-03-05 DIAGNOSIS — R51 Headache: Secondary | ICD-10-CM | POA: Insufficient documentation

## 2017-03-05 LAB — CBC
HEMATOCRIT: 42.7 % (ref 36.0–46.0)
HEMOGLOBIN: 14.2 g/dL (ref 12.0–15.0)
MCH: 33 pg (ref 26.0–34.0)
MCHC: 33.3 g/dL (ref 30.0–36.0)
MCV: 99.3 fL (ref 78.0–100.0)
Platelets: 256 10*3/uL (ref 150–400)
RBC: 4.3 MIL/uL (ref 3.87–5.11)
RDW: 15.4 % (ref 11.5–15.5)
WBC: 9.6 10*3/uL (ref 4.0–10.5)

## 2017-03-05 LAB — HEPATIC FUNCTION PANEL
ALK PHOS: 72 U/L (ref 38–126)
ALT: 14 U/L (ref 14–54)
AST: 25 U/L (ref 15–41)
Albumin: 3.4 g/dL — ABNORMAL LOW (ref 3.5–5.0)
BILIRUBIN DIRECT: 0.1 mg/dL (ref 0.1–0.5)
BILIRUBIN INDIRECT: 0.4 mg/dL (ref 0.3–0.9)
Total Bilirubin: 0.5 mg/dL (ref 0.3–1.2)
Total Protein: 6.4 g/dL — ABNORMAL LOW (ref 6.5–8.1)

## 2017-03-05 LAB — BASIC METABOLIC PANEL
ANION GAP: 11 (ref 5–15)
BUN: 14 mg/dL (ref 6–20)
CALCIUM: 9.2 mg/dL (ref 8.9–10.3)
CHLORIDE: 101 mmol/L (ref 101–111)
CO2: 25 mmol/L (ref 22–32)
Creatinine, Ser: 0.81 mg/dL (ref 0.44–1.00)
GFR calc non Af Amer: >60 mL/min (ref >60)
GLUCOSE: 98 mg/dL (ref 65–99)
POTASSIUM: 3.9 mmol/L (ref 3.5–5.1)
Sodium: 137 mmol/L (ref 135–145)

## 2017-03-05 LAB — TROPONIN I

## 2017-03-05 LAB — LIPASE, BLOOD: Lipase: 18 U/L (ref 11–51)

## 2017-03-05 MED ORDER — OMEPRAZOLE 20 MG PO CPDR: 20.0000 mg | DELAYED_RELEASE_CAPSULE | Freq: Every day | ORAL | 0 refills | Status: DC

## 2017-03-05 MED ORDER — ONDANSETRON HCL 4 MG/2ML IJ SOLN
4.0000 mg | Freq: Once | INTRAMUSCULAR | Status: AC
Start: 2017-03-05 — End: 2017-03-05
  Administered 2017-03-05: 4 mg via INTRAVENOUS
  Filled 2017-03-05: qty 2

## 2017-03-05 MED ORDER — ONDANSETRON 4 MG PO TBDP: 4.0000 mg | ORAL_TABLET | Freq: Three times a day (TID) | ORAL | 0 refills | Status: DC | PRN

## 2017-03-05 MED ORDER — SODIUM CHLORIDE 0.9 % IV BOLUS (SEPSIS)
1000.0000 mL | Freq: Once | INTRAVENOUS | Status: AC
  Administered 2017-03-05: 1000 mL via INTRAVENOUS

## 2017-03-05 MED ORDER — AMLODIPINE BESYLATE 5 MG PO TABS: 5.0000 mg | ORAL_TABLET | Freq: Every day | ORAL | 0 refills | Status: DC

## 2017-03-05 NOTE — ED Notes (Signed)
Dr Pickering at bedside 

## 2017-03-05 NOTE — ED Notes (Signed)
Pt given ginger ale and water 

## 2017-03-05 NOTE — ED Provider Notes (Signed)
[facility] DEPT Provider Note   CSN: [id number] Arrival date & time: 03/05/17  1047     History   Chief Complaint Chief Complaint  Patient presents with  . Chest Pain    HPI Lindsey Howard is a 61 y.o. female.  HPI  Patient presents with right-sided chest pain nausea vomiting with occasional diarrhea. Had it for the last 4 days. Slight cough. States she has had no change in her appetite but also says she has not been hungry. Pain is dull. Worse with her breathing. Also hypertension. States no history of hypertension but family member says that she does. Has been on antihypertensives in the past however. No dysuria. No real abdominal pain.    Diagnosis Date  . 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 Chronic back pain   . Chronic neck pain   . COPD (chronic obstructive pulmonary disease) ([place])   . Depression   . Esophagitis   . GERD (gastroesophageal reflux disease)   . Panic attacks   . Sciatica   . Speech abnormality    chronic  . Stroke [facility])     Patient Active Problem List   Diagnosis Date Noted  . Malnutrition of moderate degree 03/29/2016  . Slurred speech 03/27/2016  . Altered mental status 06/29/2012  . Cerebral ventriculomegaly 06/29/2012  . Depression 06/29/2012    Past Surgical History:  Procedure Laterality Date  . BACK SURGERY    . NECK SURGERY    . NEPHRECTOMY Left 1978   s/p trauma  . OOPHORECTOMY    . SPLENECTOMY, TOTAL  1978   s/p trauma    OB History    Gravida Para Term Preterm AB Living   4 4 4     4    SAB TAB Ectopic Multiple Live Births                   Home Medications    Prior to Admission medications   Medication Sig Start Date End Date Taking? Authorizing Provider  acetaminophen-codeine (TYLENOL #3) 300-30 MG tablet Take 1 tablet by mouth 4 (four) times daily as needed for moderate pain.   Yes [provider]  Aspirin-Salicylamide-Caffeine (BC  HEADACHE POWDER PO) Take 1 packet by mouth daily as needed. For pain   Yes [provider]    Family History Family History  Problem Relation Age of Onset  . Heart failure Mother   . Stroke Father   . Seizures Father     Social History Social History  Substance Use Topics  . Smoking status: Current Every Day Smoker    Packs/day: 3.00    Years: 40.00    Types: Cigarettes  . Smokeless tobacco: Never Used  . Alcohol use Yes     Comment: occassionally     Allergies   Latex and Penicillins   Review of Systems Review of Systems  HENT: Negative for dental problem.   Respiratory: Positive for cough.   Cardiovascular: Positive for chest pain.  Gastrointestinal: Positive for diarrhea, nausea and vomiting.  Genitourinary: Negative for flank pain.  Musculoskeletal: Negative for back pain.  Skin: Negative for pallor.  Neurological: Positive for headaches.  Hematological: Negative for adenopathy.  Psychiatric/Behavioral: Negative for confusion.     Physical Exam Updated Vital Signs BP (!) 184/116   Pulse 67   Temp 97.8 F (36.6 C) (Oral)   Resp 17   Ht 5\' 3"  (1.6 m)  Wt 64.4 kg (142 lb)   SpO2 99%   BMI 25.15 kg/m   Physical Exam  Constitutional: She appears well-developed.  HENT:  Head: Atraumatic.  Edentulous  Neck: Neck supple.  Cardiovascular: Normal rate.   Pulmonary/Chest: Effort normal. She has no wheezes. She has no rales.  Abdominal: There is no tenderness.  Musculoskeletal: She exhibits no edema.  Neurological: She is alert.  Skin: Skin is warm. Capillary refill takes less than 2 seconds.  Psychiatric: She has a normal mood and affect.     ED Treatments / Results  Labs (all labs ordered are listed, but only abnormal results are displayed) Labs Reviewed  HEPATIC FUNCTION PANEL - Abnormal; Notable for the following:       Result Value   Total Protein 6.4 (*)    Albumin 3.4 (*)    All other components within normal limits  BASIC  METABOLIC PANEL  CBC  TROPONIN I  LIPASE, BLOOD    EKG  EKG Interpretation  Date/Time:  Monday March 05 2017 11:01:17 EDT Ventricular Rate:  68 PR Interval:    QRS Duration: 88 QT Interval:  405 QTC Calculation: 431 R Axis:   7 Text Interpretation:  Sinus rhythm Confirmed by Davonna Belling [phone]) on 03/05/2017 11:24:43 AM       Radiology Dg Chest 2 View  Result Date: 03/05/2017 CLINICAL DATA:  Chest pain. EXAM: CHEST  2 VIEW COMPARISON:  03/27/2016 . FINDINGS: Mediastinum and hilar structures normal. Heart size normal. Bilateral pulmonary interstitial prominence with small left pleural effusion. No pneumothorax. IMPRESSION: Cardiomegaly with bilateral pulmonary interstitial prominence and small left pleural effusion. Findings suggest mild CHF. Cervical spine fusion. Electronically Signed   By: Marcello Moores  Register   On: 03/05/2017 12:24    Procedures Procedures (including critical care time)  Medications Ordered in ED Medications  sodium chloride 0.9 % bolus 1,000 mL (1,000 mLs Intravenous New Bag/Given 03/05/17 1156)  ondansetron (ZOFRAN) injection 4 mg (4 mg Intravenous Given 03/05/17 1157)     Initial Impression / Assessment and Plan / ED Course  I have reviewed the triage vital signs and the nursing notes.  Pertinent labs & imaging results that were available during my care of the patient were reviewed by me and considered in my medical decision making (see chart for details).     Patient with chest pain nausea and vomiting. Also hypertension. Labs overall reassuring. Feels better after treatment. Blood pressures come down some but think she will require treatment. Will start back on Norvasc. Will follow PCP. Doubt cardiac cause. Had some vascular congestion on x-ray. Discharge home.  Final Clinical Impressions(s) / ED Diagnoses   Final diagnoses:  Hypertension  Nausea and vomiting  Nonspecific chest pain      New Prescriptions New Prescriptions   No  medications on file     Davonna Belling, MD 03/05/17 1502

## 2017-03-05 NOTE — ED Notes (Signed)
Pt states that her left arm around IV site feels swollen and numb. Flushed IV with NS flush and flushed without difficulty and pulled back blood.  Dr. Alvino Chapel notified.  Order to pull out IV.

## 2017-03-05 NOTE — ED Notes (Signed)
Pt made aware to return if symptoms worsen or if any life threatening symptoms occur.   

## 2017-03-05 NOTE — ED Triage Notes (Signed)
Presents with 3 days of nausea and vomiting and right sided chest pain that is constant described as sharp, burning and stabbing associated with SOB, diaphoresis and dizziness. PT is hypertensive and denies taking antihypertensive medications. Endorses headache.

## 2017-06-21 ENCOUNTER — Emergency Department (HOSPITAL_COMMUNITY): Payer: Medicaid Other

## 2017-06-21 ENCOUNTER — Other Ambulatory Visit: Payer: Self-pay

## 2017-06-21 ENCOUNTER — Encounter (HOSPITAL_COMMUNITY): Payer: Self-pay | Admitting: *Deleted

## 2017-06-21 ENCOUNTER — Emergency Department (HOSPITAL_COMMUNITY)
Admission: EM | Admit: 2017-06-21 | Discharge: 2017-06-21 | Disposition: A | Discharge status: Home / Self Care | Payer: Medicaid Other | Attending: Emergency Medicine | Admitting: Emergency Medicine

## 2017-06-21 DIAGNOSIS — I10 Essential (primary) hypertension: Secondary | ICD-10-CM | POA: Diagnosis not present

## 2017-06-21 DIAGNOSIS — Y998 Other external cause status: Secondary | ICD-10-CM | POA: Diagnosis not present

## 2017-06-21 DIAGNOSIS — Z23 Encounter for immunization: Secondary | ICD-10-CM | POA: Diagnosis not present

## 2017-06-21 DIAGNOSIS — S2242XA Multiple fractures of ribs, left side, initial encounter for closed fracture: Secondary | ICD-10-CM | POA: Insufficient documentation

## 2017-06-21 DIAGNOSIS — S61551A Open bite of right wrist, initial encounter: Secondary | ICD-10-CM | POA: Diagnosis not present

## 2017-06-21 DIAGNOSIS — W182XXA Fall in (into) shower or empty bathtub, initial encounter: Secondary | ICD-10-CM | POA: Diagnosis not present

## 2017-06-21 DIAGNOSIS — E7800 Pure hypercholesterolemia, unspecified: Secondary | ICD-10-CM | POA: Diagnosis not present

## 2017-06-21 DIAGNOSIS — F17210 Nicotine dependence, cigarettes, uncomplicated: Secondary | ICD-10-CM | POA: Insufficient documentation

## 2017-06-21 DIAGNOSIS — Z8673 Personal history of transient ischemic attack (TIA), and cerebral infarction without residual deficits: Secondary | ICD-10-CM | POA: Insufficient documentation

## 2017-06-21 DIAGNOSIS — S299XXA Unspecified injury of thorax, initial encounter: Secondary | ICD-10-CM | POA: Diagnosis present

## 2017-06-21 DIAGNOSIS — Y929 Unspecified place or not applicable: Secondary | ICD-10-CM | POA: Diagnosis not present

## 2017-06-21 DIAGNOSIS — W5501XA Bitten by cat, initial encounter: Secondary | ICD-10-CM | POA: Diagnosis not present

## 2017-06-21 DIAGNOSIS — J449 Chronic obstructive pulmonary disease, unspecified: Secondary | ICD-10-CM | POA: Insufficient documentation

## 2017-06-21 DIAGNOSIS — Y939 Activity, unspecified: Secondary | ICD-10-CM | POA: Diagnosis not present

## 2017-06-21 DIAGNOSIS — Z79899 Other long term (current) drug therapy: Secondary | ICD-10-CM | POA: Diagnosis not present

## 2017-06-21 HISTORY — DX: Essential (primary) hypertension: I10

## 2017-06-21 HISTORY — DX: Pure hypercholesterolemia, unspecified: E78.00

## 2017-06-21 MED ORDER — SULFAMETHOXAZOLE-TRIMETHOPRIM 800-160 MG PO TABS: 1.0000 | ORAL_TABLET | Freq: Two times a day (BID) | ORAL | 0 refills | Status: AC

## 2017-06-21 MED ORDER — METRONIDAZOLE 500 MG PO TABS
500.0000 mg | ORAL_TABLET | Freq: Once | ORAL | Status: AC
  Administered 2017-06-21: 500 mg via ORAL
  Filled 2017-06-21: qty 1

## 2017-06-21 MED ORDER — SULFAMETHOXAZOLE-TRIMETHOPRIM 800-160 MG PO TABS
1.0000 | ORAL_TABLET | Freq: Once | ORAL | Status: AC
  Administered 2017-06-21: 1 via ORAL
  Filled 2017-06-21: qty 1

## 2017-06-21 MED ORDER — TETANUS-DIPHTH-ACELL PERTUSSIS 5-2.5-18.5 LF-MCG/0.5 IM SUSP
0.5000 mL | Freq: Once | INTRAMUSCULAR | Status: AC
  Administered 2017-06-21: 0.5 mL via INTRAMUSCULAR
  Filled 2017-06-21: qty 0.5

## 2017-06-21 MED ORDER — METRONIDAZOLE 500 MG PO TABS: 500.0000 mg | ORAL_TABLET | Freq: Three times a day (TID) | ORAL | 0 refills | Status: DC

## 2017-06-21 MED ORDER — METHOCARBAMOL 500 MG PO TABS: 500.0000 mg | ORAL_TABLET | Freq: Two times a day (BID) | ORAL | 0 refills | Status: DC

## 2017-06-21 NOTE — ED Notes (Signed)
Have paged RT

## 2017-06-21 NOTE — ED Notes (Signed)
PA at bedside.

## 2017-06-21 NOTE — ED Triage Notes (Signed)
Pt c/o fall into tub on Monday with pain to left rib area. Pt reports she "was out" for a minute. Denies hitting her head. Pt also c/o cat bite to right arm 2 days ago. Redness to right arm. Cat is up to date on rabies vaccine.

## 2017-06-21 NOTE — Discharge Instructions (Signed)
Use the spirometer as directed. Clean the wounds with mild soap and water.  You can apply neosporin daily.  Keep the area bandaged.  Return here in 2 days for recheck if you notice increasing redness, swelling, red streaks, drainage or increasing pain.  Follow-up with Dr. Luan Pulling in one week to recheck your ribs.  Return here for any chest pain or sudden shortness of breath

## 2017-06-21 NOTE — ED Provider Notes (Signed)
[facility] EMERGENCY DEPARTMENT Provider Note   CSN: [id number] Arrival date & time: 06/21/17  1220     History   Chief Complaint Chief Complaint  Patient presents with  . Fall  . Animal Bite    HPI Lindsey Howard is a 61 y.o. female.  HPI   Lindsey Howard is a 61 y.o. female who presents to the Emergency Department complaining of left rib pain secondary to a fall over the edge of the bathtub.  Incident occurred 2 days ago.  She complains of sharp pain to her anterior lateral left ribs.  Pain is worse with movement.  Improves when she remains at rest.  She states that she was "dazed and it knocked the breath out of me" for approximately 1 minute.  She denies any head injury or loss of consciousness.  She has not had any headaches, vomiting, neck pain, dizziness or visual changes.  She has been taking Tylenol 3 for her pain with minimal relief.  She denies chest pain or shortness of breath.  She also complains of a cat bite to her right wrist.  This occurred 2 days ago.  She states that her cat bit her when she tried to keep him from going outside.  She states she has clean the wounds with water and peroxide.  She states the cat stays inside and is up-to-date on his vaccinations.  She complains of mild pain to her wrist with movement.  She denies any numbness, swelling or pain to her fingers.  Last tetanus is unknown.   Past Medical History:  Diagnosis Date  . 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 Chronic back pain   . Chronic neck pain   . COPD (chronic obstructive pulmonary disease) ([place])   . Depression   . Esophagitis   . GERD (gastroesophageal reflux disease)   . High cholesterol   . Hypertension   . Panic attacks   . Sciatica   . Speech abnormality    chronic  . Stroke [facility])     Patient Active Problem List   Diagnosis Date Noted  . Malnutrition of moderate degree 03/29/2016  . Slurred speech  03/27/2016  . Altered mental status 06/29/2012  . Cerebral ventriculomegaly 06/29/2012  . Depression 06/29/2012    Past Surgical History:  Procedure Laterality Date  . BACK SURGERY    . NECK SURGERY    . NEPHRECTOMY Left 1978   s/p trauma  . OOPHORECTOMY    . SPLENECTOMY, TOTAL  1978   s/p trauma    OB History    Gravida Para Term Preterm AB Living   4 4 4     4    SAB TAB Ectopic Multiple Live Births                   Home Medications    Prior to Admission medications   Medication Sig Start Date End Date Taking? Authorizing Provider  acetaminophen-codeine (TYLENOL #3) 300-30 MG tablet Take 1 tablet by mouth 4 (four) times daily as needed for moderate pain.    [provider]  amLODipine (NORVASC) 5 MG tablet Take 1 tablet (5 mg total) by mouth daily. 03/05/17   Davonna Belling, MD  Aspirin-Salicylamide-Caffeine [facility] HEADACHE POWDER PO) Take 1 packet by mouth daily as needed. For pain    [provider]  omeprazole (PRILOSEC) 20 MG capsule Take 1 capsule (20 mg total) by mouth daily.  03/05/17   Davonna Belling, MD  ondansetron (ZOFRAN-ODT) 4 MG disintegrating tablet Take 1 tablet (4 mg total) by mouth every 8 (eight) hours as needed for nausea or vomiting. 03/05/17   Davonna Belling, MD    Family History Family History  Problem Relation Age of Onset  . Heart failure Mother   . Stroke Father   . Seizures Father     Social History Social History   Tobacco Use  . Smoking status: Current Every Day Smoker    Packs/day: 3.00    Years: 40.00    Pack years: 120.00    Types: Cigarettes  . Smokeless tobacco: Never Used  Substance Use Topics  . Alcohol use: Yes    Comment: occassionally  . Drug use: No     Allergies   Latex and Penicillins   Review of Systems Review of Systems  Constitutional: Negative for chills and fever.  Eyes: Negative for visual disturbance.  Respiratory: Negative for chest tightness and shortness of breath.     Cardiovascular: Positive for chest pain (left rib pain).  Gastrointestinal: Negative for abdominal pain, nausea and vomiting.  Genitourinary: Negative for difficulty urinating, dysuria and flank pain.  Musculoskeletal: Positive for arthralgias (right wrist pain). Negative for back pain, joint swelling and neck pain.  Skin: Positive for wound. Negative for color change.       Cat bite right wrist  Neurological: Negative for dizziness, syncope, weakness, numbness and headaches.  Hematological: Negative for adenopathy. Does not bruise/bleed easily.  All other systems reviewed and are negative.    Physical Exam Updated Vital Signs BP (!) 171/107   Pulse 79   Temp 97.7 F (36.5 C) (Oral)   Resp 16   Ht 5\' 3"  (1.6 m)   Wt 64.4 kg (142 lb)   SpO2 95%   BMI 25.15 kg/m   Physical Exam  Constitutional: She appears well-developed and well-nourished. No distress.  HENT:  Head: Atraumatic.  Cardiovascular: Normal rate, regular rhythm, normal heart sounds and intact distal pulses.  No murmur heard. Pulmonary/Chest: Effort normal. No respiratory distress. She has no wheezes. She exhibits tenderness (Focal tenderness to palpation of the lateral left lower ribs.  No edema or crepitus.  No ecchymosis.).  Abdominal: Soft. She exhibits no distension and no mass. There is no tenderness. There is no guarding.  Musculoskeletal:       Arms: Patient has full range of motion of the right wrist and fingers.  No edema.  Skin: Skin is warm. Capillary refill takes less than 2 seconds.  Psychiatric: She has a normal mood and affect.  Nursing note and vitals reviewed.    ED Treatments / Results  Labs (all labs ordered are listed, but only abnormal results are displayed) Labs Reviewed - No data to display  EKG  EKG Interpretation None       Radiology Dg Ribs Unilateral W/chest Left  Result Date: 06/21/2017 CLINICAL DATA:  Acute left chest and rib pain following fall 3 days ago. Initial  encounter. EXAM: LEFT RIBS AND CHEST - 3+ VIEW COMPARISON:  03/05/2017 and prior chest radiographs FINDINGS: The cardiomediastinal silhouette is unremarkable. Mild chronic interstitial opacities again noted. Minimal left basilar atelectasis noted. There is no evidence of airspace disease, pleural effusion, edema, mass or pneumothorax. Acute nondisplaced fractures of the anterior left 7th, 8th and 9th ribs noted. IMPRESSION: 1. Acute nondisplaced fractures of the anterior left 7th-9th ribs with minimal left basilar atelectasis. No evidence of pleural effusion or pneumothorax. Electronically Signed  By: Margarette Canada M.D.   On: 06/21/2017 14:28   Dg Wrist Complete Right  Result Date: 06/21/2017 CLINICAL DATA:  Posterior and lateral right wrist pain since Tuesday when there was cat bite. EXAM: RIGHT WRIST - COMPLETE 3+ VIEW COMPARISON:  Right hand series 03/22/2015 FINDINGS: There is no evidence of fracture or dislocation. There is no evidence of arthropathy or other focal bone abnormality. Soft tissues are unremarkable. Osteopenia. IMPRESSION: No acute finding. Electronically Signed   By: Monte Fantasia M.D.   On: 06/21/2017 14:22    Procedures Procedures (including critical care time)  Puncture wounds to the right wrist were cleaned with betadine and irrigated thoroughly by me with nml saline.  Pt tolerated procedure well.      Medications Ordered in ED Medications  Tdap (BOOSTRIX) injection 0.5 mL (0.5 mLs Intramuscular Given 06/21/17 1435)  sulfamethoxazole-trimethoprim (BACTRIM DS,SEPTRA DS) 800-160 MG per tablet 1 tablet (1 tablet Oral Given 06/21/17 1435)  metroNIDAZOLE (FLAGYL) tablet 500 mg (500 mg Oral Given 06/21/17 1434)     Initial Impression / Assessment and Plan / ED Course  I have reviewed the triage vital signs and the nursing notes.  Pertinent labs & imaging results that were available during my care of the patient were reviewed by me and considered in my medical decision  making (see chart for details).     TD updated.  Patient reports penicillin allergy so we will treat with Bactrim and Flagyl.  Wrist was bandaged by nursing staff. Pt is owner of the cat and vaccines are current.   Discussed all x-ray results.  Patient is well-appearing, ambulatory, no tachypnea hypoxia or tachycardia.  No x-ray findings to suggest pneumothorax.  Incentive spirometer dispensed.  Patient appears stable for discharge, she will follow-up with her PCP for recheck.  Wound care instructions were discussed as well as strict return precautions.  Final Clinical Impressions(s) / ED Diagnoses   Final diagnoses:  Closed fracture of multiple ribs of left side, initial encounter  Cat bite, initial encounter    ED Discharge Orders    None       Kem Parkinson, PA-C 06/21/17 1559    [name], DO 06/22/17 1424

## 2017-08-12 ENCOUNTER — Other Ambulatory Visit: Payer: Self-pay

## 2017-08-12 ENCOUNTER — Encounter (HOSPITAL_COMMUNITY): Payer: Self-pay | Admitting: Emergency Medicine

## 2017-08-12 ENCOUNTER — Emergency Department (HOSPITAL_COMMUNITY)
Admission: EM | Admit: 2017-08-12 | Discharge: 2017-08-12 | Disposition: A | Discharge status: Home / Self Care | Payer: Medicaid Other | Attending: Emergency Medicine | Admitting: Emergency Medicine

## 2017-08-12 ENCOUNTER — Emergency Department (HOSPITAL_COMMUNITY): Payer: Medicaid Other

## 2017-08-12 DIAGNOSIS — I10 Essential (primary) hypertension: Secondary | ICD-10-CM | POA: Insufficient documentation

## 2017-08-12 DIAGNOSIS — F17210 Nicotine dependence, cigarettes, uncomplicated: Secondary | ICD-10-CM | POA: Diagnosis not present

## 2017-08-12 DIAGNOSIS — Z91040 Latex allergy status: Secondary | ICD-10-CM | POA: Insufficient documentation

## 2017-08-12 DIAGNOSIS — Z79899 Other long term (current) drug therapy: Secondary | ICD-10-CM | POA: Insufficient documentation

## 2017-08-12 DIAGNOSIS — N390 Urinary tract infection, site not specified: Secondary | ICD-10-CM

## 2017-08-12 DIAGNOSIS — W19XXXA Unspecified fall, initial encounter: Secondary | ICD-10-CM

## 2017-08-12 DIAGNOSIS — J449 Chronic obstructive pulmonary disease, unspecified: Secondary | ICD-10-CM | POA: Diagnosis not present

## 2017-08-12 DIAGNOSIS — J181 Lobar pneumonia, unspecified organism: Secondary | ICD-10-CM | POA: Diagnosis not present

## 2017-08-12 DIAGNOSIS — R05 Cough: Secondary | ICD-10-CM | POA: Diagnosis present

## 2017-08-12 DIAGNOSIS — S0093XA Contusion of unspecified part of head, initial encounter: Secondary | ICD-10-CM

## 2017-08-12 LAB — COMPREHENSIVE METABOLIC PANEL
ALK PHOS: 93 U/L (ref 38–126)
ALT: 16 U/L (ref 14–54)
ANION GAP: 10 (ref 5–15)
AST: 25 U/L (ref 15–41)
Albumin: 3.9 g/dL (ref 3.5–5.0)
BUN: 12 mg/dL (ref 6–20)
CALCIUM: 9.9 mg/dL (ref 8.9–10.3)
CO2: 26 mmol/L (ref 22–32)
Chloride: 107 mmol/L (ref 101–111)
Creatinine, Ser: 0.91 mg/dL (ref 0.44–1.00)
GFR calc Af Amer: >60 mL/min (ref >60)
Glucose, Bld: 162 mg/dL — ABNORMAL HIGH (ref 65–99)
Potassium: 4.5 mmol/L (ref 3.5–5.1)
SODIUM: 143 mmol/L (ref 135–145)
Total Bilirubin: 0.4 mg/dL (ref 0.3–1.2)
Total Protein: 7.4 g/dL (ref 6.5–8.1)

## 2017-08-12 LAB — URINALYSIS, ROUTINE W REFLEX MICROSCOPIC
BILIRUBIN URINE: NEGATIVE
Glucose, UA: NEGATIVE mg/dL
Ketones, ur: NEGATIVE mg/dL
Nitrite: NEGATIVE
PROTEIN: 100 mg/dL — AB
SPECIFIC GRAVITY, URINE: 1.024 (ref 1.005–1.030)
pH: 5 (ref 5.0–8.0)

## 2017-08-12 LAB — CBC
HEMATOCRIT: 48 % — AB (ref 36.0–46.0)
HEMOGLOBIN: 15.6 g/dL — AB (ref 12.0–15.0)
MCH: 31.8 pg (ref 26.0–34.0)
MCHC: 32.5 g/dL (ref 30.0–36.0)
MCV: 97.8 fL (ref 78.0–100.0)
Platelets: 330 10*3/uL (ref 150–400)
RBC: 4.91 MIL/uL (ref 3.87–5.11)
RDW: 14.2 % (ref 11.5–15.5)
WBC: 10 10*3/uL (ref 4.0–10.5)

## 2017-08-12 MED ORDER — CEPHALEXIN 500 MG PO CAPS: 500.0000 mg | ORAL_CAPSULE | Freq: Four times a day (QID) | ORAL | 0 refills | Status: DC

## 2017-08-12 NOTE — Discharge Instructions (Signed)
CT scan of head and neck showed no acute findings.  You do have evidence of a small urine infection.  Prescription for antibiotic.  Increase fluids.  Follow-up with your primary care doctor.

## 2017-08-12 NOTE — ED Provider Notes (Signed)
[facility] EMERGENCY DEPARTMENT Provider Note   CSN: [id number] Arrival date & time: 08/12/17  1149     History   Chief Complaint Chief Complaint  Patient presents with  . Fall    HPI Lindsey Howard is a 62 y.o. female.  Patient has been following more recently than usual.  Today at 2 am she fell and struck her occipital area.  No change in behavior.  Past medical history includes stroke, TIA, abnormal head CT.  No prodromal illnesses.  No fever, sweats, chills, chest pain, dyspnea, confusion, motor or sensory deficits.  Severity of symptoms mild to moderate.  Nothing makes symptoms better or worse.      Past Medical History:  Diagnosis Date  . 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 Chronic back pain   . Chronic neck pain   . COPD (chronic obstructive pulmonary disease) ([place])   . Depression   . Esophagitis   . GERD (gastroesophageal reflux disease)   . High cholesterol   . Hypertension   . Panic attacks   . Sciatica   . Speech abnormality    chronic  . Stroke [facility])     Patient Active Problem List   Diagnosis Date Noted  . Malnutrition of moderate degree 03/29/2016  . Slurred speech 03/27/2016  . Altered mental status 06/29/2012  . Cerebral ventriculomegaly 06/29/2012  . Depression 06/29/2012    Past Surgical History:  Procedure Laterality Date  . BACK SURGERY    . NECK SURGERY    . NEPHRECTOMY Left 1978   s/p trauma  . OOPHORECTOMY    . SPLENECTOMY, TOTAL  1978   s/p trauma    OB History    Gravida Para Term Preterm AB Living   4 4 4     4    SAB TAB Ectopic Multiple Live Births                   Home Medications    Prior to Admission medications   Medication Sig Start Date End Date Taking? Authorizing Provider  acetaminophen-codeine (TYLENOL #3) 300-30 MG tablet Take 1 tablet by mouth 4 (four) times daily as needed for moderate pain.   Yes [provider]    Aspirin-Salicylamide-Caffeine (BC HEADACHE POWDER PO) Take 1 packet by mouth daily as needed. For pain   Yes [provider]  busPIRone (BUSPAR) 10 MG tablet Take 10 mg by mouth 2 (two) times daily.   Yes [provider]  diazepam (VALIUM) 10 MG tablet TAKE ONE TABLET BY MOUTH TWICE DAILY AS NEEDED FOR ANXIETY 07/18/17  Yes [provider]  methocarbamol (ROBAXIN) 500 MG tablet Take 1 tablet (500 mg total) 2 (two) times daily by mouth. 06/21/17  Yes Triplett, Tammy, PA-C  PROAIR HFA 108 (90 Base) MCG/ACT inhaler INHALE TWO PUFFS BY MOUTH 4 TIMES DAILY AS NEEDED FOR SHORTNESS OF BREATH 06/23/17  Yes [provider]  traZODone (DESYREL) 50 MG tablet TAKE ONE TABLET BY MOUTH AT BEDTIME 08/03/17  Yes [provider]  amLODipine (NORVASC) 5 MG tablet Take 1 tablet (5 mg total) by mouth daily. Patient not taking: Reported on 08/12/2017 03/05/17   Davonna Belling, MD  cephALEXin (KEFLEX) 500 MG capsule Take 1 capsule (500 mg total) by mouth 4 (four) times daily. 08/12/17   Nat Christen, MD  metroNIDAZOLE (FLAGYL) 500 MG tablet Take 1 tablet (500 mg total) 3 (three) times daily by mouth. For  7 days Patient not taking: Reported on 08/12/2017 06/21/17   Triplett, Tammy, PA-C  omeprazole (PRILOSEC) 20 MG capsule Take 1 capsule (20 mg total) by mouth daily. Patient not taking: Reported on 08/12/2017 03/05/17   Davonna Belling, MD  ondansetron (ZOFRAN-ODT) 4 MG disintegrating tablet Take 1 tablet (4 mg total) by mouth every 8 (eight) hours as needed for nausea or vomiting. Patient not taking: Reported on 08/12/2017 03/05/17   Davonna Belling, MD    Family History Family History  Problem Relation Age of Onset  . Heart failure Mother   . Stroke Father   . Seizures Father     Social History Social History   Tobacco Use  . Smoking status: Current Every Day Smoker    Packs/day: 3.00    Years: 40.00    Pack years: 120.00    Types: Cigarettes  . Smokeless tobacco:  Never Used  Substance Use Topics  . Alcohol use: Yes    Comment: occassionally  . Drug use: No     Allergies   Latex and Penicillins   Review of Systems Review of Systems  All other systems reviewed and are negative.    Physical Exam Updated Vital Signs BP 112/83   Pulse 68   Temp 97.9 F (36.6 C) (Oral)   Resp 16   Ht 5\' 3"  (1.6 m)   Wt 63.5 kg (140 lb)   SpO2 95%   BMI 24.80 kg/m   Physical Exam  Constitutional:  Answers questions appropriately  HENT:  Head: Normocephalic and atraumatic.  Eyes: Conjunctivae are normal.  Neck: Neck supple.  Cardiovascular: Normal rate and regular rhythm.  Pulmonary/Chest: Effort normal and breath sounds normal.  Abdominal: Soft. Bowel sounds are normal.  Musculoskeletal: Normal range of motion.  Neurological: She is alert.  Skin: Skin is warm and dry.  Psychiatric:  Flat affect  Nursing note and vitals reviewed.    ED Treatments / Results  Labs (all labs ordered are listed, but only abnormal results are displayed) Labs Reviewed  CBC - Abnormal; Notable for the following components:      Result Value   Hemoglobin 15.6 (*)    HCT 48.0 (*)    All other components within normal limits  URINALYSIS, ROUTINE W REFLEX MICROSCOPIC - Abnormal; Notable for the following components:   APPearance CLOUDY (*)    Hgb urine dipstick SMALL (*)    Protein, ur 100 (*)    Leukocytes, UA MODERATE (*)    Bacteria, UA RARE (*)    Squamous Epithelial / LPF 6-30 (*)    All other components within normal limits  COMPREHENSIVE METABOLIC PANEL - Abnormal; Notable for the following components:   Glucose, Bld 162 (*)    All other components within normal limits  URINE CULTURE    EKG  EKG Interpretation None       Radiology Ct Head Wo Contrast  Result Date: 08/12/2017 CLINICAL DATA:  Head trauma, high risk clinical exam, initial encounter, multiple falls over 3 weeks, fell this morning striking head on bathtub, uncertain loss of  consciousness, having headache EXAM: CT HEAD WITHOUT CONTRAST CT CERVICAL SPINE WITHOUT CONTRAST TECHNIQUE: Multidetector CT imaging of the head and cervical spine was performed following the standard protocol without intravenous contrast. Multiplanar CT image reconstructions of the cervical spine were also generated. COMPARISON:  03/27/2016 CT head, 03/28/2016 and brain FINDINGS: CT HEAD FINDINGS Brain: Prominent ventricular system out of proportion to degree of cortical atrophy with mild dilatation of the third  ventricle and decompressed fourth ventricle, felt by prior MR to represent aqueductal stenosis. No midline shift or mass effect. Otherwise normal appearance of the parenchyma. No intracranial hemorrhage, mass lesion, evidence of acute infarction. No extra-axial fluid collection. Vascular: Atherosclerotic calcification of internal carotid arteries bilaterally at carotid siphons. No hyperdense vessels. Skull: Demineralized but intact Sinuses/Orbits: Clear Other: N/A CT CERVICAL SPINE FINDINGS Alignment: Normal Skull base and vertebrae: Mild osseous demineralization. Visualized skullbase intact. Prior anterior fusion at C4-C6. Bony fusion present at the surgical levels. Hardware intact. Vertebral body heights maintained without fracture or bone destruction. Mild scattered facet degenerative changes. Soft tissues and spinal canal: Prevertebral soft tissues normal thickness. Cervical soft tissues otherwise unremarkable. 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 No acute intracranial abnormalities. Postsurgical changes of anterior fusion at C4-C6. No acute cervical spine abnormalities. Electronically Signed   By: Lavonia Dana M.D.   On: 08/12/2017 17:17   Ct Cervical Spine Wo Contrast  Result Date: 08/12/2017 CLINICAL DATA:  Head trauma, high risk clinical exam, initial encounter, multiple  falls over 3 weeks, fell this morning striking head on bathtub, uncertain loss of consciousness, having headache EXAM: CT HEAD WITHOUT CONTRAST CT CERVICAL SPINE WITHOUT CONTRAST TECHNIQUE: Multidetector CT imaging of the head and cervical spine was performed following the standard protocol without intravenous contrast. Multiplanar CT image reconstructions of the cervical spine were also generated. COMPARISON:  03/27/2016 CT head, 03/28/2016 and brain FINDINGS: CT HEAD FINDINGS Brain: Prominent ventricular system out of proportion to degree of cortical atrophy with mild dilatation of the third ventricle and decompressed fourth ventricle, felt by prior MR to represent aqueductal stenosis. No midline shift or mass effect. Otherwise normal appearance of the parenchyma. No intracranial hemorrhage, mass lesion, evidence of acute infarction. No extra-axial fluid collection. Vascular: Atherosclerotic calcification of internal carotid arteries bilaterally at carotid siphons. No hyperdense vessels. Skull: Demineralized but intact Sinuses/Orbits: Clear Other: N/A CT CERVICAL SPINE FINDINGS Alignment: Normal Skull base and vertebrae: Mild osseous demineralization. Visualized skullbase intact. Prior anterior fusion at C4-C6. Bony fusion present at the surgical levels. Hardware intact. Vertebral body heights maintained without fracture or bone destruction. Mild scattered facet degenerative changes. Soft tissues and spinal canal: Prevertebral soft tissues normal thickness. Cervical soft tissues otherwise unremarkable. 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 No acute intracranial abnormalities. Postsurgical changes of anterior fusion at C4-C6. No acute cervical spine abnormalities. Electronically Signed   By: Lavonia Dana M.D.   On: 08/12/2017 17:17    Procedures Procedures (including critical care  time)  Medications Ordered in ED Medications - No data to display   Initial Impression / Assessment and Plan / ED Course  I have reviewed the triage vital signs and the nursing notes.  Pertinent labs & imaging results that were available during my care of the patient were reviewed by me and considered in my medical decision making (see chart for details).     Patient presents with a fall striking her occipital area.  Labs reassuring.  CT scan of head shows stable ventriculomegaly compared to previous MRI.  CT cervical spine negative.  Her behavior appears appropriate.  She does have a minor urinary infection.  Discharge medication Keflex 500 mg.  Urine culture.  Will follow up with primary care.  Final Clinical Impressions(s) / ED Diagnoses   Final diagnoses:  Fall, initial encounter  Contusion of head, unspecified part of head, initial encounter  Urinary tract infection without hematuria, site unspecified    ED Discharge Orders        Ordered    cephALEXin (KEFLEX) 500 MG capsule  4 times daily     08/12/17 1823       Nat Christen, MD 08/12/17 1911

## 2017-08-12 NOTE — ED Triage Notes (Signed)
Patient c/o dizziness and unsteady gait with multiple falls x3 weeks. Patient fell this morning hitting her head on bathtub. Patient unsure of LOC. Patient reports headache. Denies taking any type of blood thinners. Patient states seen in past for same reason and told she "had fluid on her brain."

## 2017-08-12 NOTE — ED Notes (Signed)
Pt smokes greater than 2 PPD  Has been falling recently- fell today in the shower and SO had to get her up  Has hx of  Strokes TIAs  States her head hurts 9/10

## 2017-08-14 LAB — URINE CULTURE

## 2017-10-05 ENCOUNTER — Encounter (HOSPITAL_COMMUNITY): Payer: Self-pay | Admitting: Emergency Medicine

## 2017-10-05 ENCOUNTER — Emergency Department (HOSPITAL_COMMUNITY): Payer: Medicaid Other

## 2017-10-05 ENCOUNTER — Emergency Department (HOSPITAL_COMMUNITY)
Admission: EM | Admit: 2017-10-05 | Discharge: 2017-10-05 | Disposition: A | Discharge status: Home / Self Care | Payer: Medicaid Other | Attending: Emergency Medicine | Admitting: Emergency Medicine

## 2017-10-05 DIAGNOSIS — R55 Syncope and collapse: Secondary | ICD-10-CM | POA: Diagnosis not present

## 2017-10-05 DIAGNOSIS — Z91040 Latex allergy status: Secondary | ICD-10-CM | POA: Diagnosis not present

## 2017-10-05 DIAGNOSIS — Y999 Unspecified external cause status: Secondary | ICD-10-CM | POA: Insufficient documentation

## 2017-10-05 DIAGNOSIS — W01198A Fall on same level from slipping, tripping and stumbling with subsequent striking against other object, initial encounter: Secondary | ICD-10-CM | POA: Insufficient documentation

## 2017-10-05 DIAGNOSIS — S0990XA Unspecified injury of head, initial encounter: Secondary | ICD-10-CM | POA: Diagnosis not present

## 2017-10-05 DIAGNOSIS — J449 Chronic obstructive pulmonary disease, unspecified: Secondary | ICD-10-CM | POA: Insufficient documentation

## 2017-10-05 DIAGNOSIS — R51 Headache: Secondary | ICD-10-CM | POA: Diagnosis not present

## 2017-10-05 DIAGNOSIS — S0101XA Laceration without foreign body of scalp, initial encounter: Secondary | ICD-10-CM | POA: Diagnosis not present

## 2017-10-05 DIAGNOSIS — S098XXA Other specified injuries of head, initial encounter: Secondary | ICD-10-CM | POA: Diagnosis present

## 2017-10-05 DIAGNOSIS — I10 Essential (primary) hypertension: Secondary | ICD-10-CM | POA: Diagnosis not present

## 2017-10-05 DIAGNOSIS — Y939 Activity, unspecified: Secondary | ICD-10-CM | POA: Insufficient documentation

## 2017-10-05 DIAGNOSIS — F17210 Nicotine dependence, cigarettes, uncomplicated: Secondary | ICD-10-CM | POA: Insufficient documentation

## 2017-10-05 DIAGNOSIS — W19XXXA Unspecified fall, initial encounter: Secondary | ICD-10-CM

## 2017-10-05 DIAGNOSIS — Z79899 Other long term (current) drug therapy: Secondary | ICD-10-CM | POA: Insufficient documentation

## 2017-10-05 DIAGNOSIS — Z8673 Personal history of transient ischemic attack (TIA), and cerebral infarction without residual deficits: Secondary | ICD-10-CM | POA: Insufficient documentation

## 2017-10-05 DIAGNOSIS — Y929 Unspecified place or not applicable: Secondary | ICD-10-CM | POA: Insufficient documentation

## 2017-10-05 LAB — BASIC METABOLIC PANEL
ANION GAP: 12 (ref 5–15)
BUN: 8 mg/dL (ref 6–20)
CALCIUM: 9.5 mg/dL (ref 8.9–10.3)
CO2: 24 mmol/L (ref 22–32)
CREATININE: 0.73 mg/dL (ref 0.44–1.00)
Chloride: 106 mmol/L (ref 101–111)
GLUCOSE: 82 mg/dL (ref 65–99)
Potassium: 4.1 mmol/L (ref 3.5–5.1)
Sodium: 142 mmol/L (ref 135–145)

## 2017-10-05 LAB — URINALYSIS, ROUTINE W REFLEX MICROSCOPIC
Bilirubin Urine: NEGATIVE
Glucose, UA: NEGATIVE mg/dL
Ketones, ur: NEGATIVE mg/dL
LEUKOCYTES UA: NEGATIVE
NITRITE: NEGATIVE
PH: 5 (ref 5.0–8.0)
Protein, ur: 30 mg/dL — AB
SPECIFIC GRAVITY, URINE: 1.004 — AB (ref 1.005–1.030)

## 2017-10-05 LAB — CBC WITH DIFFERENTIAL/PLATELET
BASOS ABS: 0 10*3/uL (ref 0.0–0.1)
BASOS PCT: 0 %
EOS PCT: 1 %
Eosinophils Absolute: 0.1 10*3/uL (ref 0.0–0.7)
HEMATOCRIT: 45.9 % (ref 36.0–46.0)
Hemoglobin: 15 g/dL (ref 12.0–15.0)
Lymphocytes Relative: 37 %
Lymphs Abs: 3.7 10*3/uL (ref 0.7–4.0)
MCH: 31.8 pg (ref 26.0–34.0)
MCHC: 32.7 g/dL (ref 30.0–36.0)
MCV: 97.2 fL (ref 78.0–100.0)
MONO ABS: 0.9 10*3/uL (ref 0.1–1.0)
MONOS PCT: 9 %
Neutro Abs: 5.3 10*3/uL (ref 1.7–7.7)
Neutrophils Relative %: 53 %
PLATELETS: 338 10*3/uL (ref 150–400)
RBC: 4.72 MIL/uL (ref 3.87–5.11)
RDW: 14.8 % (ref 11.5–15.5)
WBC: 10 10*3/uL (ref 4.0–10.5)

## 2017-10-05 NOTE — Discharge Instructions (Signed)
Tests showed no life-threatening condition.  Follow-up your primary care doctor. °

## 2017-10-05 NOTE — ED Provider Notes (Signed)
[facility] EMERGENCY DEPARTMENT Provider Note   CSN: [id number] Arrival date & time: 10/05/17  1426     History   Chief Complaint Chief Complaint  Patient presents with  . Fall    HPI Lindsey Howard is a 62 y.o. female.  Patient tripped and fell last night striking her left temporal parietal scalp on a "safe" in the home..  She allegedly had a brief loss of consciousness.  She reports an unsteady gait for an unknown length of time.  No mental status changes.  She is able to move all extremities.  No confusion noted.  Past medical history includes COPD, depression, hypertension, speech abnormality, previous stroke.  Severity of symptoms is minimal.      Past Medical History:  Diagnosis Date  . 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 Chronic back pain   . Chronic neck pain   . COPD (chronic obstructive pulmonary disease) ([place])   . Depression   . Esophagitis   . GERD (gastroesophageal reflux disease)   . High cholesterol   . Hypertension   . Panic attacks   . Sciatica   . Speech abnormality    chronic  . Stroke [facility])     Patient Active Problem List   Diagnosis Date Noted  . Malnutrition of moderate degree 03/29/2016  . Slurred speech 03/27/2016  . Altered mental status 06/29/2012  . Cerebral ventriculomegaly 06/29/2012  . Depression 06/29/2012    Past Surgical History:  Procedure Laterality Date  . BACK SURGERY    . NECK SURGERY    . NEPHRECTOMY Left 1978   s/p trauma  . OOPHORECTOMY    . SPLENECTOMY, TOTAL  1978   s/p trauma    OB History    Gravida Para Term Preterm AB Living   4 4 4     4    SAB TAB Ectopic Multiple Live Births                   Home Medications    Prior to Admission medications   Medication Sig Start Date End Date Taking? Authorizing Provider  acetaminophen-codeine (TYLENOL #3) 300-30 MG tablet Take 1 tablet by mouth 4 (four) times daily as needed for moderate  pain.   Yes [provider]  amLODipine (NORVASC) 5 MG tablet Take 1 tablet (5 mg total) by mouth daily. 03/05/17  Yes Davonna Belling, MD  Aspirin-Salicylamide-Caffeine [facility] HEADACHE POWDER PO) Take 1 packet by mouth daily as needed. For pain   Yes [provider]  busPIRone (BUSPAR) 10 MG tablet Take 10 mg by mouth 2 (two) times daily.   Yes [provider]  cephALEXin (KEFLEX) 500 MG capsule Take 1 capsule (500 mg total) by mouth 4 (four) times daily. 08/12/17  Yes Nat Christen, MD  diazepam (VALIUM) 10 MG tablet TAKE ONE TABLET BY MOUTH TWICE DAILY AS NEEDED FOR ANXIETY 07/18/17  Yes [provider]  Dihydrotachysterol (DHT PO) Take 1 capsule by mouth daily.   Yes [provider]  PROAIR HFA 108 (90 Base) MCG/ACT inhaler INHALE TWO PUFFS BY MOUTH 4 TIMES DAILY AS NEEDED FOR SHORTNESS OF BREATH 06/23/17  Yes [provider]  traZODone (DESYREL) 50 MG tablet TAKE ONE TABLET BY MOUTH AT BEDTIME 08/03/17  Yes [provider]  methocarbamol (ROBAXIN) 500 MG tablet Take 1 tablet (500 mg total) 2 (two) times daily by mouth. 06/21/17   Triplett, Tammy, PA-C  Family History Family History  Problem Relation Age of Onset  . Heart failure Mother   . Stroke Father   . Seizures Father     Social History Social History   Tobacco Use  . Smoking status: Current Every Day Smoker    Packs/day: 3.00    Years: 40.00    Pack years: 120.00    Types: Cigarettes  . Smokeless tobacco: Never Used  Substance Use Topics  . Alcohol use: Yes    Comment: occassionally  . Drug use: No     Allergies   Latex and Penicillins   Review of Systems Review of Systems  All other systems reviewed and are negative.    Physical Exam Updated Vital Signs BP 134/74 (BP Location: Left Arm)   Pulse 79   Temp (!) 97.2 F (36.2 C) (Oral)   Resp 18   Ht 5\' 3"  (1.6 m)   Wt 63.5 kg (140 lb)   SpO2 98%   BMI 24.80 kg/m   Physical Exam    Constitutional: She is oriented to person, place, and time. She appears well-developed and well-nourished.  No acute distress.  Speech is slightly impaired, but this is normal for her.  HENT:  Head: Normocephalic.  1.5 cm superficial laceration on left temporoparietal scalp.  Eyes: Conjunctivae are normal.  Neck: Neck supple.  Cardiovascular: Normal rate and regular rhythm.  Pulmonary/Chest: Effort normal and breath sounds normal.  Abdominal: Soft. Bowel sounds are normal.  Musculoskeletal: Normal range of motion.  Neurological: She is alert and oriented to person, place, and time.  Skin: Skin is warm and dry.  Psychiatric: She has a normal mood and affect. Her behavior is normal.  Nursing note and vitals reviewed.    ED Treatments / Results  Labs (all labs ordered are listed, but only abnormal results are displayed) Labs Reviewed  URINALYSIS, ROUTINE W REFLEX MICROSCOPIC - Abnormal; Notable for the following components:      Result Value   Color, Urine STRAW (*)    Specific Gravity, Urine 1.004 (*)    Hgb urine dipstick SMALL (*)    Protein, ur 30 (*)    Bacteria, UA RARE (*)    Squamous Epithelial / LPF 0-5 (*)    All other components within normal limits  CBC WITH DIFFERENTIAL/PLATELET  BASIC METABOLIC PANEL    EKG  EKG Interpretation None       Radiology Ct Head Wo Contrast  Result Date: 10/05/2017 CLINICAL DATA:  tripping and falling last night and striking head on the corner of the safe with positive loss of consciousness. Laceration with bleeding controlled. Sent from urgent care for "neurologic symptoms" but nurse could not state what they were and pt denies new weakness or deficit. EXAM: CT HEAD WITHOUT CONTRAST TECHNIQUE: Contiguous axial images were obtained from the base of the skull through the vertex without intravenous contrast. COMPARISON:  08/12/2017 FINDINGS: Brain: Dilated lateral ventricles as before. Negative for acute intracranial hemorrhage,  midline shift, focal parenchymal edema, mass, or mass-effect. Acute infarct may be inapparent on noncontrast CT. Vascular: Atherosclerotic and physiologic intracranial calcifications. Skull: Normal. Negative for fracture or focal lesion. Sinuses/Orbits: No acute finding. Other: None. IMPRESSION: 1. Negative for bleed or other acute intracranial process. 2. Stable ventriculomegaly. Electronically Signed   By: Lucrezia Europe M.D.   On: 10/05/2017 16:17    Procedures Procedures (including critical care time)  Medications Ordered in ED Medications - No data to display   Initial Impression / Assessment and  Plan / ED Course  I have reviewed the triage vital signs and the nursing notes.  Pertinent labs & imaging results that were available during my care of the patient were reviewed by me and considered in my medical decision making (see chart for details).     Patient had an accidental trip and fall last night.  Her daughter reports normal behavior.  Labs, urinalysis, CT head showed no acute findings.  Her scalp laceration does not require suturing.  Final Clinical Impressions(s) / ED Diagnoses   Final diagnoses:  Fall, initial encounter  Minor head injury, initial encounter    ED Discharge Orders    None       Nat Christen, MD 10/05/17 1723

## 2017-10-05 NOTE — ED Notes (Signed)
EKG given to MD cook

## 2017-10-05 NOTE — ED Triage Notes (Signed)
Pt reports tripping and falling last night and striking head on the corner of the safe with positive loss of consciousness.  Laceration with bleeding controlled.  Sent from urgent care for "neurologic symptoms" but nurse could not state what they were and pt denies new weakness or deficit.

## 2017-11-05 ENCOUNTER — Encounter (HOSPITAL_COMMUNITY): Payer: Self-pay | Admitting: *Deleted

## 2017-11-05 ENCOUNTER — Observation Stay (HOSPITAL_COMMUNITY)
Admission: EM | Admit: 2017-11-05 | Discharge: 2017-11-07 | Disposition: A | Discharge status: Home / Self Care | Payer: Medicaid Other | Attending: Pulmonary Disease | Admitting: Pulmonary Disease

## 2017-11-05 ENCOUNTER — Other Ambulatory Visit: Payer: Self-pay

## 2017-11-05 ENCOUNTER — Emergency Department (HOSPITAL_COMMUNITY): Payer: Medicaid Other

## 2017-11-05 DIAGNOSIS — F329 Major depressive disorder, single episode, unspecified: Secondary | ICD-10-CM | POA: Diagnosis present

## 2017-11-05 DIAGNOSIS — F17210 Nicotine dependence, cigarettes, uncomplicated: Secondary | ICD-10-CM | POA: Diagnosis not present

## 2017-11-05 DIAGNOSIS — M542 Cervicalgia: Secondary | ICD-10-CM | POA: Diagnosis not present

## 2017-11-05 DIAGNOSIS — Z79899 Other long term (current) drug therapy: Secondary | ICD-10-CM | POA: Diagnosis not present

## 2017-11-05 DIAGNOSIS — G8190 Hemiplegia, unspecified affecting unspecified side: Principal | ICD-10-CM | POA: Insufficient documentation

## 2017-11-05 DIAGNOSIS — R42 Dizziness and giddiness: Secondary | ICD-10-CM | POA: Diagnosis not present

## 2017-11-05 DIAGNOSIS — R410 Disorientation, unspecified: Secondary | ICD-10-CM

## 2017-11-05 DIAGNOSIS — G9389 Other specified disorders of brain: Secondary | ICD-10-CM | POA: Diagnosis not present

## 2017-11-05 DIAGNOSIS — Z720 Tobacco use: Secondary | ICD-10-CM

## 2017-11-05 DIAGNOSIS — M6281 Muscle weakness (generalized): Secondary | ICD-10-CM | POA: Diagnosis not present

## 2017-11-05 DIAGNOSIS — R059 Cough, unspecified: Secondary | ICD-10-CM

## 2017-11-05 DIAGNOSIS — I639 Cerebral infarction, unspecified: Secondary | ICD-10-CM | POA: Insufficient documentation

## 2017-11-05 DIAGNOSIS — R4182 Altered mental status, unspecified: Secondary | ICD-10-CM | POA: Diagnosis present

## 2017-11-05 DIAGNOSIS — J449 Chronic obstructive pulmonary disease, unspecified: Secondary | ICD-10-CM | POA: Insufficient documentation

## 2017-11-05 DIAGNOSIS — F32A Depression, unspecified: Secondary | ICD-10-CM | POA: Diagnosis present

## 2017-11-05 DIAGNOSIS — R05 Cough: Secondary | ICD-10-CM

## 2017-11-05 DIAGNOSIS — R29898 Other symptoms and signs involving the musculoskeletal system: Secondary | ICD-10-CM

## 2017-11-05 DIAGNOSIS — R200 Anesthesia of skin: Secondary | ICD-10-CM | POA: Diagnosis not present

## 2017-11-05 DIAGNOSIS — I10 Essential (primary) hypertension: Secondary | ICD-10-CM | POA: Insufficient documentation

## 2017-11-05 LAB — DIFFERENTIAL
BASOS ABS: 0 10*3/uL (ref 0.0–0.1)
Basophils Relative: 0 %
EOS ABS: 0 10*3/uL (ref 0.0–0.7)
Eosinophils Relative: 0 %
Lymphocytes Relative: 24 %
Lymphs Abs: 4.3 10*3/uL — ABNORMAL HIGH (ref 0.7–4.0)
MONO ABS: 2.1 10*3/uL — AB (ref 0.1–1.0)
MONOS PCT: 12 %
NEUTROS ABS: 11.4 10*3/uL — AB (ref 1.7–7.7)
Neutrophils Relative %: 64 %

## 2017-11-05 LAB — CBC
HEMATOCRIT: 47.8 % — AB (ref 36.0–46.0)
HEMOGLOBIN: 16.1 g/dL — AB (ref 12.0–15.0)
MCH: 32.4 pg (ref 26.0–34.0)
MCHC: 33.7 g/dL (ref 30.0–36.0)
MCV: 96.2 fL (ref 78.0–100.0)
Platelets: 333 10*3/uL (ref 150–400)
RBC: 4.97 MIL/uL (ref 3.87–5.11)
RDW: 14.3 % (ref 11.5–15.5)
WBC: 17.8 10*3/uL — AB (ref 4.0–10.5)

## 2017-11-05 NOTE — ED Notes (Signed)
Daughter is concerned because pt will sometimes self medicate herself with her medicines

## 2017-11-05 NOTE — ED Triage Notes (Addendum)
Pt c/o pain to back of neck area that started tonight, pt states that she fell on march 1 and hit her head but has not fallen since, pt also states that her legs get heavy when she lays down at night and that has been going on for awhile. Pt admits to two sips of beer tonight,  Pt is also concerned because she has been having episodes of dizziness for several months.

## 2017-11-05 NOTE — ED Provider Notes (Signed)
[facility] EMERGENCY DEPARTMENT Provider Note   CSN: [id number] Arrival date & time: 11/05/17  2210     History   Chief Complaint Chief Complaint  Patient presents with  . Neck Pain    HPI Lindsey Howard is a 62 y.o. female.  Patient with multiple complaints.  She is difficult to follow.  Her daughter states that she has been complaining of neck pain since she fell on March 1 and hit her head.  Has not not had any trauma since.  Patient reports she is a left-sided weakness for the past 2 days especially in her left arm.  She is also concerned because her legs "feel heavy" when she lays down at night but feels better when she gets up.  This is been going on for a while.  She has had some dizziness and difficulty walking worsening over the past 2 days as well.  Patient is on multiple sedating medications including Valium and trazodone.  Her family is concerned that she is mixing her pills the other does not know what she is taking.  She denies any chest pain or shortness of breath.  She denies any change in her medications recently.  No difficulty breathing or swallowing.  No speech change.  Denies any new trauma.  The history is provided by the patient.  Neck Pain   Associated symptoms include numbness and weakness. Pertinent negatives include no chest pain and no headaches.    Past Medical History:  Diagnosis Date  . 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 Chronic back pain   . Chronic neck pain   . COPD (chronic obstructive pulmonary disease) ([place])   . Depression   . Esophagitis   . GERD (gastroesophageal reflux disease)   . High cholesterol   . Hypertension   . Panic attacks   . Sciatica   . Speech abnormality    chronic  . Stroke [facility])     Patient Active Problem List   Diagnosis Date Noted  . Malnutrition of moderate degree 03/29/2016  . Slurred speech 03/27/2016  . Altered mental status 06/29/2012  .  Cerebral ventriculomegaly 06/29/2012  . Depression 06/29/2012    Past Surgical History:  Procedure Laterality Date  . BACK SURGERY    . NECK SURGERY    . NEPHRECTOMY Left 1978   s/p trauma  . OOPHORECTOMY    . SPLENECTOMY, TOTAL  1978   s/p trauma     OB History    Gravida  4   Para  4   Term  4   Preterm      AB      Living  4     SAB      TAB      Ectopic      Multiple      Live Births               Home Medications    Prior to Admission medications   Medication Sig Start Date End Date Taking? Authorizing Provider  acetaminophen-codeine (TYLENOL #3) 300-30 MG tablet Take 1 tablet by mouth 4 (four) times daily as needed for moderate pain.   Yes [provider]  amLODipine (NORVASC) 5 MG tablet Take 1 tablet (5 mg total) by mouth daily. 03/05/17  Yes Davonna Belling, MD  Aspirin-Salicylamide-Caffeine [facility] HEADACHE POWDER PO) Take 1 packet by mouth daily as needed. For pain   Yes [provider]  busPIRone (BUSPAR) 10 MG tablet Take 10 mg by mouth 2 (two) times daily.   Yes [provider]  diazepam (VALIUM) 10 MG tablet TAKE ONE TABLET BY MOUTH TWICE DAILY 07/18/17  Yes [provider]  Dihydrotachysterol (DHT PO) Take 1 capsule by mouth daily.   Yes [provider]  omeprazole (PRILOSEC) 20 MG capsule Take 20 mg by mouth daily. 10/27/17  Yes [provider]  PROAIR HFA 108 (90 Base) MCG/ACT inhaler INHALE TWO PUFFS BY MOUTH 4 TIMES DAILY AS NEEDED FOR SHORTNESS OF BREATH 06/23/17  Yes [provider]  traZODone (DESYREL) 50 MG tablet TAKE ONE TABLET BY MOUTH AT BEDTIME 08/03/17  Yes [provider]  cephALEXin (KEFLEX) 500 MG capsule Take 1 capsule (500 mg total) by mouth 4 (four) times daily. Patient not taking: Reported on 11/05/2017 08/12/17   Nat Christen, MD    Family History Family History  Problem Relation Age of Onset  . Heart failure Mother   . Stroke Father   . Seizures  Father     Social History Social History   Tobacco Use  . Smoking status: Current Every Day Smoker    Packs/day: 3.00    Years: 40.00    Pack years: 120.00    Types: Cigarettes  . Smokeless tobacco: Never Used  Substance Use Topics  . Alcohol use: Yes    Comment: occassionally  . Drug use: No     Allergies   Latex and Penicillins   Review of Systems Review of Systems  Constitutional: Positive for activity change and appetite change. Negative for fatigue.  Eyes: Negative for visual disturbance.  Respiratory: Negative for cough, chest tightness and shortness of breath.   Cardiovascular: Negative for chest pain.  Gastrointestinal: Negative for abdominal pain, nausea and vomiting.  Genitourinary: Negative for dysuria and hematuria.  Musculoskeletal: Positive for neck pain.  Skin: Negative for rash.  Neurological: Positive for dizziness, speech difficulty, weakness, light-headedness and numbness. Negative for headaches.   all other systems are negative except as noted in the HPI and PMH.     Physical Exam Updated Vital Signs BP (!) 151/101   Pulse (!) 106   Temp 98.3 F (36.8 C) (Oral)   Resp 17   SpO2 96%   Physical Exam  Constitutional: She is oriented to person, place, and time. She appears well-developed and well-nourished. No distress.  HENT:  Head: Normocephalic and atraumatic.  Mouth/Throat: Oropharynx is clear and moist. No oropharyngeal exudate.  Edentulous, dry mucous membranes  Eyes: Pupils are equal, round, and reactive to light. Conjunctivae and EOM are normal.  Neck: Normal range of motion. Neck supple.  Midline cervical tenderness across C7, C8, T1, T2  Cardiovascular: Normal rate, regular rhythm, normal heart sounds and intact distal pulses.  No murmur heard. Pulmonary/Chest: Effort normal and breath sounds normal. No respiratory distress.  Abdominal: Soft. There is no tenderness. There is no rebound and no guarding.  Musculoskeletal: Normal  range of motion. She exhibits no edema or tenderness.  Neurological: She is alert and oriented to person, place, and time. No cranial nerve deficit. She exhibits normal muscle tone. Coordination normal.  Cranial nerves II to XII intact, tongue midline, dysarthria present but patient states this is normal.  Mild pronator drift of left arm.  Poor effort on neurological exam throughout.  4/5 strength of bilateral lower extremities and right arm.  3/5 strength of left upper extremity and difficulty holding up against gravity.  Decreased grip strength  in the left.  No ataxia to finger to nose.  Positive Romberg, ataxic gait  Skin: Skin is warm. Capillary refill takes less than 2 seconds.  Psychiatric: She has a normal mood and affect. Her behavior is normal.  Nursing note and vitals reviewed.    ED Treatments / Results  Labs (all labs ordered are listed, but only abnormal results are displayed) Labs Reviewed  PROTIME-INR - Abnormal; Notable for the following components:      Result Value   Prothrombin Time 15.4 (*)    All other components within normal limits  CBC - Abnormal; Notable for the following components:   WBC 17.8 (*)    Hemoglobin 16.1 (*)    HCT 47.8 (*)    All other components within normal limits  DIFFERENTIAL - Abnormal; Notable for the following components:   Neutro Abs 11.4 (*)    Lymphs Abs 4.3 (*)    Monocytes Absolute 2.1 (*)    All other components within normal limits  COMPREHENSIVE METABOLIC PANEL - Abnormal; Notable for the following components:   Glucose, Bld 115 (*)    Total Protein 8.7 (*)    ALT 10 (*)    Alkaline Phosphatase 127 (*)    All other components within normal limits  RAPID URINE DRUG SCREEN, HOSP PERFORMED - Abnormal; Notable for the following components:   Benzodiazepines POSITIVE (*)    All other components within normal limits  URINALYSIS, ROUTINE W REFLEX MICROSCOPIC - Abnormal; Notable for the following components:   Hgb urine dipstick  MODERATE (*)    Protein, ur 100 (*)    Bacteria, UA RARE (*)    Squamous Epithelial / LPF 0-5 (*)    All other components within normal limits  ETHANOL  APTT  TROPONIN I    EKG EKG Interpretation  Date/Time:  Monday November 05 2017 23:39:48 EDT Ventricular Rate:  93 PR Interval:    QRS Duration: 88 QT Interval:  364 QTC Calculation: 453 R Axis:   4 Text Interpretation:  Sinus rhythm Consider left atrial enlargement Baseline wander in lead(s) V2 No significant change was found Confirmed by Ezequiel Essex [phone]) on 11/06/2017 12:03:50 AM   Radiology Ct Head Wo Contrast  Result Date: 11/06/2017 CLINICAL DATA:  Neck pain, fall and hit head EXAM: CT HEAD WITHOUT CONTRAST CT CERVICAL SPINE WITHOUT CONTRAST TECHNIQUE: Multidetector CT imaging of the head and cervical spine was performed following the standard protocol without intravenous contrast. Multiplanar CT image reconstructions of the cervical spine were also generated. COMPARISON:  CT brain 10/05/2017, CT brain and cervical spine 08/12/2017, MRI brain 03/28/2016 FINDINGS: CT HEAD FINDINGS Brain: No acute territorial infarction, hemorrhage or intracranial mass is visualized. Stable enlargement of the lateral and third ventricles with normal size fourth ventricle. Vascular: No hyperdense vessels.  Carotid vascular calcification Skull: Normal. Negative for fracture or focal lesion. Sinuses/Orbits: No acute finding. Other: None CT CERVICAL SPINE FINDINGS Alignment: Straightening of the cervical spine. No subluxation. Facet alignment within normal limits. Skull base and vertebrae: No acute fracture. No primary bone lesion or focal pathologic process. Soft tissues and spinal canal: No prevertebral fluid or swelling. No visible canal hematoma. Disc levels: Anterior plate and screw fixation C4 through C6. Mild degenerative changes at C6-C7. Upper chest: Negative. Other: None IMPRESSION: 1. No CT evidence for acute intracranial abnormality. Stable  moderate lateral and third ventricular enlargement. 2. Straightening of the cervical spine with postoperative changes C4 through C6. No definite acute osseous abnormality. Electronically Signed  By: Donavan Foil M.D.   On: 11/06/2017 00:20   Ct Cervical Spine Wo Contrast  Result Date: 11/06/2017 CLINICAL DATA:  Neck pain, fall and hit head EXAM: CT HEAD WITHOUT CONTRAST CT CERVICAL SPINE WITHOUT CONTRAST TECHNIQUE: Multidetector CT imaging of the head and cervical spine was performed following the standard protocol without intravenous contrast. Multiplanar CT image reconstructions of the cervical spine were also generated. COMPARISON:  CT brain 10/05/2017, CT brain and cervical spine 08/12/2017, MRI brain 03/28/2016 FINDINGS: CT HEAD FINDINGS Brain: No acute territorial infarction, hemorrhage or intracranial mass is visualized. Stable enlargement of the lateral and third ventricles with normal size fourth ventricle. Vascular: No hyperdense vessels.  Carotid vascular calcification Skull: Normal. Negative for fracture or focal lesion. Sinuses/Orbits: No acute finding. Other: None CT CERVICAL SPINE FINDINGS Alignment: Straightening of the cervical spine. No subluxation. Facet alignment within normal limits. Skull base and vertebrae: No acute fracture. No primary bone lesion or focal pathologic process. Soft tissues and spinal canal: No prevertebral fluid or swelling. No visible canal hematoma. Disc levels: Anterior plate and screw fixation C4 through C6. Mild degenerative changes at C6-C7. Upper chest: Negative. Other: None IMPRESSION: 1. No CT evidence for acute intracranial abnormality. Stable moderate lateral and third ventricular enlargement. 2. Straightening of the cervical spine with postoperative changes C4 through C6. No definite acute osseous abnormality. Electronically Signed   By: Donavan Foil M.D.   On: 11/06/2017 00:20    Procedures Procedures (including critical care time)  Medications Ordered  in ED Medications - No data to display   Initial Impression / Assessment and Plan / ED Course  I have reviewed the triage vital signs and the nursing notes.  Pertinent labs & imaging results that were available during my care of the patient were reviewed by me and considered in my medical decision making (see chart for details).    Patient with multiple complaints.  Most concerning is left arm weakness, dizziness, difficulty walking has been worsening over the past 2 days.  Code stroke not activated due to delay in presentation.  EKG is unchanged.  CT head and C-spine are negative.  Patient's labs are reassuring other than mild leukocytosis.  Patient does have subtle left arm weakness ongoing with drift, unsteady gait and difficulty walking.  Cannot rule out new stroke.  She is also on multiple sedating medications which may be contributing to her presentation.  Plan observation overnight for stroke rule out with MRI in the morning.  Discussed with Dr. Manuella Ghazi.  Final Clinical Impressions(s) / ED Diagnoses   Final diagnoses:  Left arm weakness    ED Discharge Orders    None       Carles Florea, Annie Main, MD 11/06/17 0201

## 2017-11-06 ENCOUNTER — Observation Stay (HOSPITAL_COMMUNITY): Payer: Medicaid Other

## 2017-11-06 ENCOUNTER — Emergency Department (HOSPITAL_COMMUNITY): Payer: Medicaid Other

## 2017-11-06 ENCOUNTER — Observation Stay (HOSPITAL_BASED_OUTPATIENT_CLINIC_OR_DEPARTMENT_OTHER): Payer: Medicaid Other

## 2017-11-06 DIAGNOSIS — I69952 Hemiplegia and hemiparesis following unspecified cerebrovascular disease affecting left dominant side: Secondary | ICD-10-CM | POA: Diagnosis not present

## 2017-11-06 DIAGNOSIS — I361 Nonrheumatic tricuspid (valve) insufficiency: Secondary | ICD-10-CM

## 2017-11-06 DIAGNOSIS — I639 Cerebral infarction, unspecified: Secondary | ICD-10-CM | POA: Diagnosis present

## 2017-11-06 DIAGNOSIS — M542 Cervicalgia: Secondary | ICD-10-CM

## 2017-11-06 DIAGNOSIS — Z720 Tobacco use: Secondary | ICD-10-CM

## 2017-11-06 DIAGNOSIS — J449 Chronic obstructive pulmonary disease, unspecified: Secondary | ICD-10-CM

## 2017-11-06 DIAGNOSIS — G8190 Hemiplegia, unspecified affecting unspecified side: Secondary | ICD-10-CM

## 2017-11-06 LAB — URINALYSIS, ROUTINE W REFLEX MICROSCOPIC
Bilirubin Urine: NEGATIVE
GLUCOSE, UA: NEGATIVE mg/dL
KETONES UR: NEGATIVE mg/dL
Leukocytes, UA: NEGATIVE
Nitrite: NEGATIVE
PH: 6 (ref 5.0–8.0)
Protein, ur: 100 mg/dL — AB
Specific Gravity, Urine: 1.008 (ref 1.005–1.030)

## 2017-11-06 LAB — COMPREHENSIVE METABOLIC PANEL
ALBUMIN: 4 g/dL (ref 3.5–5.0)
ALK PHOS: 127 U/L — AB (ref 38–126)
ALT: 10 U/L — AB (ref 14–54)
ANION GAP: 14 (ref 5–15)
AST: 20 U/L (ref 15–41)
BUN: 9 mg/dL (ref 6–20)
CALCIUM: 10 mg/dL (ref 8.9–10.3)
CO2: 24 mmol/L (ref 22–32)
Chloride: 102 mmol/L (ref 101–111)
Creatinine, Ser: 0.84 mg/dL (ref 0.44–1.00)
GFR calc Af Amer: >60 mL/min (ref >60)
GFR calc non Af Amer: >60 mL/min (ref >60)
GLUCOSE: 115 mg/dL — AB (ref 65–99)
Potassium: 4.3 mmol/L (ref 3.5–5.1)
SODIUM: 140 mmol/L (ref 135–145)
Total Bilirubin: 0.6 mg/dL (ref 0.3–1.2)
Total Protein: 8.7 g/dL — ABNORMAL HIGH (ref 6.5–8.1)

## 2017-11-06 LAB — RAPID URINE DRUG SCREEN, HOSP PERFORMED
Amphetamines: NOT DETECTED
BARBITURATES: NOT DETECTED
Benzodiazepines: POSITIVE — AB
Cocaine: NOT DETECTED
Opiates: NOT DETECTED
Tetrahydrocannabinol: NOT DETECTED

## 2017-11-06 LAB — TROPONIN I: Troponin I: <0.03 ng/mL (ref <0.03)

## 2017-11-06 LAB — GLUCOSE, CAPILLARY: GLUCOSE-CAPILLARY: 93 mg/dL (ref 65–99)

## 2017-11-06 LAB — LIPID PANEL
CHOL/HDL RATIO: 2.9 ratio
Cholesterol: 228 mg/dL — ABNORMAL HIGH (ref 0–200)
HDL: 78 mg/dL (ref >40)
LDL Cholesterol: 137 mg/dL — ABNORMAL HIGH (ref 0–99)
TRIGLYCERIDES: 66 mg/dL (ref <150)
VLDL: 13 mg/dL (ref 0–40)

## 2017-11-06 LAB — ETHANOL: Alcohol, Ethyl (B): <10 mg/dL (ref <10)

## 2017-11-06 LAB — APTT: aPTT: 32 seconds (ref 24–36)

## 2017-11-06 LAB — HEMOGLOBIN A1C
Hgb A1c MFr Bld: 6.2 % — ABNORMAL HIGH (ref 4.8–5.6)
Mean Plasma Glucose: 131.24 mg/dL

## 2017-11-06 LAB — PROTIME-INR
INR: 1.23
PROTHROMBIN TIME: 15.4 s — AB (ref 11.4–15.2)

## 2017-11-06 LAB — ECHOCARDIOGRAM COMPLETE

## 2017-11-06 MED ORDER — ONDANSETRON HCL 4 MG PO TABS: 4.0000 mg | ORAL_TABLET | Freq: Four times a day (QID) | ORAL | Status: DC | PRN

## 2017-11-06 MED ORDER — KETOROLAC TROMETHAMINE 15 MG/ML IJ SOLN
15.0000 mg | Freq: Four times a day (QID) | INTRAMUSCULAR | Status: DC | PRN
  Administered 2017-11-06 – 2017-11-07 (×2): 15 mg via INTRAVENOUS
  Filled 2017-11-06 (×2): qty 1

## 2017-11-06 MED ORDER — ACETAMINOPHEN 650 MG RE SUPP: 650.0000 mg | Freq: Four times a day (QID) | RECTAL | Status: DC | PRN

## 2017-11-06 MED ORDER — LACTATED RINGERS IV SOLN
INTRAVENOUS | Status: AC
  Administered 2017-11-06: 03:00:00 via INTRAVENOUS

## 2017-11-06 MED ORDER — IPRATROPIUM-ALBUTEROL 0.5-2.5 (3) MG/3ML IN SOLN: 3.0000 mL | Freq: Four times a day (QID) | RESPIRATORY_TRACT | Status: DC | PRN

## 2017-11-06 MED ORDER — ASPIRIN 325 MG PO TABS
325.0000 mg | ORAL_TABLET | Freq: Every day | ORAL | Status: DC
  Administered 2017-11-06 – 2017-11-07 (×2): 325 mg via ORAL
  Filled 2017-11-06 (×2): qty 1

## 2017-11-06 MED ORDER — GUAIFENESIN-DM 100-10 MG/5ML PO SYRP: 5.0000 mL | ORAL_SOLUTION | ORAL | Status: DC | PRN

## 2017-11-06 MED ORDER — ASPIRIN 300 MG RE SUPP: 300.0000 mg | Freq: Every day | RECTAL | Status: DC

## 2017-11-06 MED ORDER — GADOBENATE DIMEGLUMINE 529 MG/ML IV SOLN
13.0000 mL | Freq: Once | INTRAVENOUS | Status: AC | PRN
  Administered 2017-11-06: 13 mL via INTRAVENOUS

## 2017-11-06 MED ORDER — STROKE: EARLY STAGES OF RECOVERY BOOK
Freq: Once | Status: AC
  Administered 2017-11-06: 03:00:00
  Filled 2017-11-06 (×2): qty 1

## 2017-11-06 MED ORDER — ONDANSETRON HCL 4 MG/2ML IJ SOLN: 4.0000 mg | Freq: Four times a day (QID) | INTRAMUSCULAR | Status: DC | PRN

## 2017-11-06 MED ORDER — ENOXAPARIN SODIUM 40 MG/0.4ML ~~LOC~~ SOLN
40.0000 mg | SUBCUTANEOUS | Status: DC
  Administered 2017-11-06: 40 mg via SUBCUTANEOUS
  Filled 2017-11-06 (×2): qty 0.4

## 2017-11-06 MED ORDER — NICOTINE 14 MG/24HR TD PT24
14.0000 mg | MEDICATED_PATCH | Freq: Every day | TRANSDERMAL | Status: DC
  Administered 2017-11-06: 14 mg via TRANSDERMAL
  Filled 2017-11-06 (×2): qty 1

## 2017-11-06 MED ORDER — ACETAMINOPHEN 325 MG PO TABS
650.0000 mg | ORAL_TABLET | Freq: Four times a day (QID) | ORAL | Status: DC | PRN
  Administered 2017-11-06 – 2017-11-07 (×4): 650 mg via ORAL
  Filled 2017-11-06 (×4): qty 2

## 2017-11-06 NOTE — Progress Notes (Signed)
*  PRELIMINARY RESULTS* Echocardiogram 2D Echocardiogram has been performed.  Leavy Cella 11/06/2017, 1:34 PM

## 2017-11-06 NOTE — Evaluation (Signed)
Occupational Therapy Evaluation Patient Details Name: Lindsey Howard MRN: [id number] DOB: Apr 07, 1956 Today's Date: 11/06/2017    History of Present Illness Lindsey Howard is a 62 y.o. female with medical history significant for depression/anxiety, hypertension, and COPD with ongoing tobacco abuse who states that she has had persistent neck pain ever since she fell and hit her head on 3/1 with no subsequent falls since this incident..  She has had some worsening episodes of dizziness and states that she has also had some trouble ambulating with heaviness in her legs.  This appears to have started approximately 2 days ago and she also noted some significant weakness to her left arm.  She called her 2 daughters who noted at home that patient was on multiple sedating medications including Valium and trazodone which were apparently mixed together and the patient does not know how she is taking them.  Daughters at bedside state that patient at baseline does have speech difficulty and does stutter.   Clinical Impression   Pt received on arrival back from MRI, agreeable to OT evaluation with husband present. Pt reports symptoms have gone on since falling at beginning of March and hitting her head on a safe, worsening neck pain and dizziness over past couple of weeks. On evaluation pt demonstrates BUE weakness and limited ROM due to severe neck pain, coordination appears intact with grooming however pt and husband report she has difficulty cutting food and pouring coffee, seemingly having difficulty with hand/eye coordination. Pt is at supervision/min guard level with ADLs, has family available if/when husband is unavailable. Recommend HHOT on discharge to improve safety and independence in the home with functional tasks. Will continue to follow while in acute care.     Follow Up Recommendations  Home health OT    Equipment Recommendations  Tub/shower seat       Precautions / Restrictions  Precautions Precautions: Fall Restrictions Weight Bearing Restrictions: No      Mobility Bed Mobility               General bed mobility comments: pt at EOB on OT arrival  Transfers Overall transfer level: Needs assistance Equipment used: 1 person hand held assist;Straight cane Transfers: Sit to/from Stand Sit to Stand: Min assist                  ADL either performed or assessed with clinical judgement   ADL Overall ADL's : Needs assistance/impaired     Grooming: Wash/dry hands;Min guard;Standing               Lower Body Dressing: Supervision/safety;Sitting/lateral leans   Toilet Transfer: Min guard;Stand-pivot   Toileting- Clothing Manipulation and Hygiene: Min guard;Sitting/lateral lean       Functional mobility during ADLs: Min guard;Cane       Vision Baseline Vision/History: (right visual field deficit at baseline) Patient Visual Report: No change from baseline Vision Assessment?: No apparent visual deficits Additional Comments: No new deficits            Pertinent Vitals/Pain Pain Assessment: 0-10 Pain Score: 7  Pain Location: neck Pain Descriptors / Indicators: Sharp;Shooting Pain Intervention(s): Limited activity within patient's tolerance;Monitored during session;Premedicated before session;Repositioned     Hand Dominance Left   Extremity/Trunk Assessment Upper Extremity Assessment Upper Extremity Assessment: RUE deficits/detail;LUE deficits/detail RUE Deficits / Details: strength 3-/5, ROM at 50% due to pain LUE Deficits / Details: strength 3/5, ROM at 50% due to pain   Lower Extremity Assessment Lower Extremity Assessment: Defer to  PT evaluation   Cervical / Trunk Assessment Cervical / Trunk Assessment: Normal   Communication Communication Communication: No difficulties   Cognition Arousal/Alertness: Awake/alert Behavior During Therapy: WFL for tasks assessed/performed Overall Cognitive Status: Within Functional  Limits for tasks assessed                                                Home Living Family/patient expects to be discharged to:: Private residence Living Arrangements: Spouse/significant other Available Help at Discharge: Family;Available 24 hours/day Type of Home: Mobile home Home Access: Stairs to enter Entrance Stairs-Number of Steps: 4 Entrance Stairs-Rails: Right;Left;Can reach both Home Layout: One level     Bathroom Shower/Tub: Teacher, early years/pre: Standard     Home Equipment: Environmental consultant - 2 wheels;Cane - single point                   OT Problem List: Decreased strength;Decreased range of motion;Decreased activity tolerance;Impaired balance (sitting and/or standing);Decreased safety awareness;Decreased knowledge of use of DME or AE;Impaired UE functional use;Pain      OT Treatment/Interventions: Self-care/ADL training;Therapeutic exercise;DME and/or AE instruction;Therapeutic activities;Patient/family education    OT Goals(Current goals can be found in the care plan section) Acute Rehab OT Goals Patient Stated Goal: to have less pain  OT Goal Formulation: With patient Time For Goal Achievement: 11/20/17 Potential to Achieve Goals: Good  OT Frequency: Min 2X/week    AM-PAC PT "6 Clicks" Daily Activity     Outcome Measure Help from another person eating meals?: None Help from another person taking care of personal grooming?: A Little Help from another person toileting, which includes using toliet, bedpan, or urinal?: A Little Help from another person bathing (including washing, rinsing, drying)?: A Little Help from another person to put on and taking off regular upper body clothing?: A Little Help from another person to put on and taking off regular lower body clothing?: A Little 6 Click Score: 19   End of Session Equipment Utilized During Treatment: Gait belt;Other (comment)(cane)  Activity Tolerance: Patient limited by  pain Patient left: in bed;with call bell/phone within reach;with family/visitor present  OT Visit Diagnosis: Muscle weakness (generalized) (M62.81);Pain Pain - Right/Left: Left Pain - part of body: Shoulder(neck)                Time: 5027-7412 OT Time Calculation (min): 32 min Charges:  OT General Charges $OT Visit: 1 Visit OT Evaluation $OT Eval Low Complexity: [place], OTR/L  [phone] 11/06/2017, 9:20 AM

## 2017-11-06 NOTE — ED Notes (Signed)
Report given to RN on 300 

## 2017-11-06 NOTE — H&P (Addendum)
History and Physical    Lindsey Howard:[id number] DOB: [birth year] DOA: 11/05/2017  PCP: Sinda Du, MD   Patient coming from: Home  Chief Complaint: Neck pain, dizziness, weakness  HPI: Lindsey Howard is a 62 y.o. female with medical history significant for depression/anxiety, hypertension, and COPD with ongoing tobacco abuse who states that she has had persistent neck pain ever since she fell and hit her head on 3/1 with no subsequent falls since this incident..  She has had some worsening episodes of dizziness and states that she has also had some trouble ambulating with heaviness in her legs.  This appears to have started approximately 2 days ago and she also noted some significant weakness to her left arm.  She called her 2 daughters who noted at home that patient was on multiple sedating medications including Valium and trazodone which were apparently mixed together and the patient does not know how she is taking them.  Daughters at bedside state that patient at baseline does have speech difficulty and does stutter. She continues to have a headache, but denies any visual changes, dysarthria, or trouble swallowing. Patient denies any dysuria or hematuria.  She denies incontinence of stool or urine. She has been having and ongoing productive cough with brownish sputum.   ED Course: Vital signs are noted to be stable and laboratory data does demonstrate some leukocytosis of 17,800.  CT of the head and neck performed with no acute findings.  EKG was sinus rhythm.  Urine analysis with no findings of UTI.  Chest x-ray has not been performed.  She is currently in no respiratory distress and on room air.  Review of Systems: Difficult to obtain from patient due to current condition.  Past Medical History:  Diagnosis Date  . 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 Chronic back pain   . Chronic neck pain   . COPD (chronic  obstructive pulmonary disease) ([place])   . Depression   . Esophagitis   . GERD (gastroesophageal reflux disease)   . High cholesterol   . Hypertension   . Panic attacks   . Sciatica   . Speech abnormality    chronic  . Stroke [facility])     Past Surgical History:  Procedure Laterality Date  . BACK SURGERY    . NECK SURGERY    . NEPHRECTOMY Left 1978   s/p trauma  . OOPHORECTOMY    . SPLENECTOMY, TOTAL  1978   s/p trauma     reports that she has been smoking cigarettes.  She has a 120.00 pack-year smoking history. She has never used smokeless tobacco. She reports that she drinks alcohol. She reports that she does not use drugs.  Allergies  Allergen Reactions  . Latex Other (See Comments)    Burns,irritates skin  . Penicillins Swelling    Has patient had a PCN reaction causing immediate rash, facial/tongue/throat swelling, SOB or lightheadedness with hypotension: No Has patient had a PCN reaction causing severe rash involving mucus membranes or skin necrosis: No Has patient had a PCN reaction that required hospitalization No Has patient had a PCN reaction occurring within the last 10 years: No If all of the above answers are "NO", then may proceed with Cephalosporin use.     Family History  Problem Relation Age of Onset  . Heart failure Mother   . Stroke Father   . Seizures Father     Prior to Admission  medications   Medication Sig Start Date End Date Taking? Authorizing Provider  acetaminophen-codeine (TYLENOL #3) 300-30 MG tablet Take 1 tablet by mouth 4 (four) times daily as needed for moderate pain.   Yes [provider]  amLODipine (NORVASC) 5 MG tablet Take 1 tablet (5 mg total) by mouth daily. 03/05/17  Yes Davonna Belling, MD  Aspirin-Salicylamide-Caffeine [facility] HEADACHE POWDER PO) Take 1 packet by mouth daily as needed. For pain   Yes [provider]  busPIRone (BUSPAR) 10 MG tablet Take 10 mg by mouth 2 (two) times daily.   Yes [provider]  diazepam (VALIUM) 10 MG tablet TAKE ONE TABLET BY MOUTH TWICE DAILY 07/18/17  Yes [provider]  Dihydrotachysterol (DHT PO) Take 1 capsule by mouth daily.   Yes [provider]  omeprazole (PRILOSEC) 20 MG capsule Take 20 mg by mouth daily. 10/27/17  Yes [provider]  PROAIR HFA 108 (90 Base) MCG/ACT inhaler INHALE TWO PUFFS BY MOUTH 4 TIMES DAILY AS NEEDED FOR SHORTNESS OF BREATH 06/23/17  Yes [provider]  traZODone (DESYREL) 50 MG tablet TAKE ONE TABLET BY MOUTH AT BEDTIME 08/03/17  Yes [provider]  cephALEXin (KEFLEX) 500 MG capsule Take 1 capsule (500 mg total) by mouth 4 (four) times daily. Patient not taking: Reported on 11/05/2017 08/12/17   Nat Christen, MD    Physical Exam: Vitals:   11/06/17 0000 11/06/17 0030 11/06/17 0116 11/06/17 0130  BP: 132/79 124/79  136/84  Pulse: 95 89  95  Resp: (!) 21 (!) 30  20  Temp:   99.7 F (37.6 C) 99.7 F (37.6 C)  TempSrc:    Oral  SpO2: 96% 93%  95%    Constitutional: NAD, calm, comfortable Vitals:   11/06/17 0000 11/06/17 0030 11/06/17 0116 11/06/17 0130  BP: 132/79 124/79  136/84  Pulse: 95 89  95  Resp: (!) 21 (!) 30  20  Temp:   99.7 F (37.6 C) 99.7 F (37.6 C)  TempSrc:    Oral  SpO2: 96% 93%  95%   Eyes: lids and conjunctivae normal ENMT: Mucous membranes are moist.  Neck: normal, supple Respiratory: clear to auscultation bilaterally. Normal respiratory effort. No accessory muscle use. On RA. Cardiovascular: Regular rate and rhythm, no murmurs. No extremity edema. Abdomen: no tenderness, no distention. Bowel sounds positive.  Musculoskeletal:  No joint deformity upper and lower extremities.  Neurologically noted to have good strength in 3 extremities except for left arm that is approximately 3+/5.  Sensation to touch intact.  Patient was not ambulated.  She is noted to have some mild dizziness currently. Skin: no rashes, lesions, ulcers.  Psychiatric:  Normal judgment and insight. Alert and oriented x 3. Normal mood.   Labs on Admission: I have personally reviewed following labs and imaging studies  CBC: Recent Labs  Lab 11/05/17 2332  WBC 17.8*  NEUTROABS 11.4*  HGB 16.1*  HCT 47.8*  MCV 96.2  PLT 696   Basic Metabolic Panel: Recent Labs  Lab 11/05/17 2332  NA 140  K 4.3  CL 102  CO2 24  GLUCOSE 115*  BUN 9  CREATININE 0.84  CALCIUM 10.0   GFR: CrCl cannot be calculated (Unknown ideal weight.). Liver Function Tests: Recent Labs  Lab 11/05/17 2332  AST 20  ALT 10*  ALKPHOS 127*  BILITOT 0.6  PROT 8.7*  ALBUMIN 4.0   No results for input(s): LIPASE, AMYLASE in the last 168 hours. No results for input(s):  AMMONIA in the last 168 hours. Coagulation Profile: Recent Labs  Lab 11/05/17 2332  INR 1.23   Cardiac Enzymes: Recent Labs  Lab 11/05/17 2332  TROPONINI <0.03   BNP (last 3 results) No results for input(s): PROBNP in the last 8760 hours. HbA1C: No results for input(s): HGBA1C in the last 72 hours. CBG: No results for input(s): GLUCAP in the last 168 hours. Lipid Profile: No results for input(s): CHOL, HDL, LDLCALC, TRIG, CHOLHDL, LDLDIRECT in the last 72 hours. Thyroid Function Tests: No results for input(s): TSH, T4TOTAL, FREET4, T3FREE, THYROIDAB in the last 72 hours. Anemia Panel: No results for input(s): VITAMINB12, FOLATE, FERRITIN, TIBC, IRON, RETICCTPCT in the last 72 hours. Urine analysis:    Component Value Date/Time   COLORURINE YELLOW 11/05/2017 2320   APPEARANCEUR CLEAR 11/05/2017 2320   LABSPEC 1.008 11/05/2017 2320   PHURINE 6.0 11/05/2017 2320   GLUCOSEU NEGATIVE 11/05/2017 2320   HGBUR MODERATE (A) 11/05/2017 2320   BILIRUBINUR NEGATIVE 11/05/2017 2320   KETONESUR NEGATIVE 11/05/2017 2320   PROTEINUR 100 (A) 11/05/2017 2320   UROBILINOGEN 0.2 12/20/2014 1712   NITRITE NEGATIVE 11/05/2017 2320   LEUKOCYTESUR NEGATIVE 11/05/2017 2320    Radiological Exams on  Admission: Ct Head Wo Contrast  Result Date: 11/06/2017 CLINICAL DATA:  Neck pain, fall and hit head EXAM: CT HEAD WITHOUT CONTRAST CT CERVICAL SPINE WITHOUT CONTRAST TECHNIQUE: Multidetector CT imaging of the head and cervical spine was performed following the standard protocol without intravenous contrast. Multiplanar CT image reconstructions of the cervical spine were also generated. COMPARISON:  CT brain 10/05/2017, CT brain and cervical spine 08/12/2017, MRI brain 03/28/2016 FINDINGS: CT HEAD FINDINGS Brain: No acute territorial infarction, hemorrhage or intracranial mass is visualized. Stable enlargement of the lateral and third ventricles with normal size fourth ventricle. Vascular: No hyperdense vessels.  Carotid vascular calcification Skull: Normal. Negative for fracture or focal lesion. Sinuses/Orbits: No acute finding. Other: None CT CERVICAL SPINE FINDINGS Alignment: Straightening of the cervical spine. No subluxation. Facet alignment within normal limits. Skull base and vertebrae: No acute fracture. No primary bone lesion or focal pathologic process. Soft tissues and spinal canal: No prevertebral fluid or swelling. No visible canal hematoma. Disc levels: Anterior plate and screw fixation C4 through C6. Mild degenerative changes at C6-C7. Upper chest: Negative. Other: None IMPRESSION: 1. No CT evidence for acute intracranial abnormality. Stable moderate lateral and third ventricular enlargement. 2. Straightening of the cervical spine with postoperative changes C4 through C6. No definite acute osseous abnormality. Electronically Signed   By: Donavan Foil M.D.   On: 11/06/2017 00:20   Ct Cervical Spine Wo Contrast  Result Date: 11/06/2017 CLINICAL DATA:  Neck pain, fall and hit head EXAM: CT HEAD WITHOUT CONTRAST CT CERVICAL SPINE WITHOUT CONTRAST TECHNIQUE: Multidetector CT imaging of the head and cervical spine was performed following the standard protocol without intravenous contrast. Multiplanar  CT image reconstructions of the cervical spine were also generated. COMPARISON:  CT brain 10/05/2017, CT brain and cervical spine 08/12/2017, MRI brain 03/28/2016 FINDINGS: CT HEAD FINDINGS Brain: No acute territorial infarction, hemorrhage or intracranial mass is visualized. Stable enlargement of the lateral and third ventricles with normal size fourth ventricle. Vascular: No hyperdense vessels.  Carotid vascular calcification Skull: Normal. Negative for fracture or focal lesion. Sinuses/Orbits: No acute finding. Other: None CT CERVICAL SPINE FINDINGS Alignment: Straightening of the cervical spine. No subluxation. Facet alignment within normal limits. Skull base and vertebrae: No acute fracture. No primary bone lesion or focal pathologic process.  Soft tissues and spinal canal: No prevertebral fluid or swelling. No visible canal hematoma. Disc levels: Anterior plate and screw fixation C4 through C6. Mild degenerative changes at C6-C7. Upper chest: Negative. Other: None IMPRESSION: 1. No CT evidence for acute intracranial abnormality. Stable moderate lateral and third ventricular enlargement. 2. Straightening of the cervical spine with postoperative changes C4 through C6. No definite acute osseous abnormality. Electronically Signed   By: Donavan Foil M.D.   On: 11/06/2017 00:20    EKG: Independently reviewed. SR at 93bpm.  Assessment/Plan Principal Problem:   Hemiparesis (HCC) Active Problems:   Altered mental status   Cerebral ventriculomegaly   Depression   Neck pain   COPD (chronic obstructive pulmonary disease) (HCC)   Tobacco abuse    1. Acute encephalopathy with left-sided hemiparesis, dizziness, and neck pain.  I believe that much of her symptoms could be attributed to some toxic form of encephalopathy with ongoing use of sedating medications with noncompliance, but with the focality of findings on neuro exam should rule out CVA for which I will order MRI/MRA of head and neck along with 2D  echocardiogram.  She is edentulous and will do dysphasia screen.  Fasting lipid profile and hemoglobin A1c.  N.p.o. for now.  Monitor on telemetry with fall precautions.  PT/OT evaluation.  Avoidance of home sedating medications. Neurochecks. Toradol prn pain. 2. Hypertension.  Hold home amlodipine and allow for permissive hypertension at this time. 3. COPD with nonproductive cough.  She does not have bronchospasms or acute exacerbation.  Duo nebs ordered as needed for dyspnea or wheezing.  Robitussin for cough.  Check chest x-ray. Nicotine patch.   DVT prophylaxis: Lovenox Code Status: Full Family Communication: 2 daughters at bedside Disposition Plan: Workup for CVA  Consults called: None Admission status: Obs, tele   Mozella Rexrode Darleen Crocker DO [facility] Pager [phone]  If 7PM-7AM, please contact night-coverage [email or web address] Password [email or web address]  11/06/2017, 1:41 AM

## 2017-11-06 NOTE — ED Notes (Signed)
Pt returned from ct,  

## 2017-11-06 NOTE — Evaluation (Signed)
Physical Therapy Evaluation Patient Details Name: Lindsey Howard MRN: [id number] DOB: [birth year] Today's Date: 11/06/2017   History of Present Illness  LAURABELLE GORCZYCA is a 62 y.o. female with medical history significant for depression/anxiety, hypertension, and COPD with ongoing tobacco abuse who states that she has had persistent neck pain ever since she fell and hit her head on 3/1 with no subsequent falls since this incident..  She has had some worsening episodes of dizziness and states that she has also had some trouble ambulating with heaviness in her legs.  This appears to have started approximately 2 days ago and she also noted some significant weakness to her left arm.  She called her 2 daughters who noted at home that patient was on multiple sedating medications including Valium and trazodone which were apparently mixed together and the patient does not know how she is taking them.  Daughters at bedside state that patient at baseline does have speech difficulty and does stutter.    Clinical Impression  Patient mildly weaker on LLE vs RLE, limited for out of bed and gait mostly due to c/o wavy sensation visually when standing, otherwise most of her c/o are neck pain and discomfort.  Patient tolerated sitting up at bedside to eat breakfast with her spouse in room after therapy.  Patient will benefit from continued physical therapy in hospital and recommended venue below to increase strength, balance, endurance for safe ADLs and gait.     Follow Up Recommendations Home health PT;Supervision for mobility/OOB    Equipment Recommendations  None recommended by PT    Recommendations for Other Services       Precautions / Restrictions Precautions Precautions: Fall Restrictions Weight Bearing Restrictions: No      Mobility  Bed Mobility Overal bed mobility: Modified Independent Bed Mobility: Supine to Sit;Sit to Supine           General bed mobility comments: uses siderail to  pull self up for supine to sitting  Transfers Overall transfer level: Needs assistance Equipment used: Straight cane;1 person hand held assist Transfers: Sit to/from Stand;Stand Pivot Transfers Sit to Stand: Min assist Stand pivot transfers: Min assist          Ambulation/Gait Ambulation/Gait assistance: Min assist Ambulation Distance (Feet): 12 Feet Assistive device: Straight cane;1 person hand held assist Gait Pattern/deviations: Decreased step length - right;Decreased step length - left;Decreased stride length;Step-to pattern Gait velocity: decreased Gait velocity interpretation: Below normal speed for age/gender General Gait Details: demonstrates slow unsteady cadence with mostly 3 point gait pattern using cane, limited mostly due to c/o woozy/wavy like motion when standing per patient  Stairs            Wheelchair Mobility    Modified Rankin (Stroke Patients Only)       Balance Overall balance assessment: Needs assistance Sitting-balance support: Feet supported;No upper extremity supported Sitting balance-Leahy Scale: Good     Standing balance support: Single extremity supported;During functional activity Standing balance-Leahy Scale: Fair                               Pertinent Vitals/Pain Pain Assessment: 0-10 Pain Score: 7  Pain Location: neck Pain Descriptors / Indicators: Sharp;Shooting Pain Intervention(s): Limited activity within patient's tolerance;Monitored during session    [place] expects to be discharged to:: Private residence Living Arrangements: Spouse/significant other Available Help at Discharge: Family;Available 24 hours/day Type of Home: Mobile home Home Access: Stairs  to enter Entrance Stairs-Rails: Right;Left;Can reach both   Home Layout: One level Home Equipment: Walker - 2 wheels;Cane - single point      Prior Function                 Hand Dominance   Dominant Hand: Left     Extremity/Trunk Assessment   Upper Extremity Assessment Upper Extremity Assessment: Defer to OT evaluation    Lower Extremity Assessment Lower Extremity Assessment: Generalized weakness;RLE deficits/detail;LLE deficits/detail RLE Deficits / Details: grossly 4+/5 LLE Deficits / Details: grossly 3+/5    Cervical / Trunk Assessment Cervical / Trunk Assessment: Normal  Communication   Communication: No difficulties  Cognition Arousal/Alertness: Awake/alert Behavior During Therapy: WFL for tasks assessed/performed Overall Cognitive Status: Within Functional Limits for tasks assessed                                        General Comments      Exercises     Assessment/Plan    PT Assessment Patient needs continued PT services  PT Problem List Decreased strength;Decreased activity tolerance;Decreased balance;Decreased mobility       PT Treatment Interventions Gait training;Stair training;Functional mobility training;Therapeutic activities;Therapeutic exercise;Patient/family education;Neuromuscular re-education    PT Goals (Current goals can be found in the Care Plan section)  Acute Rehab PT Goals Patient Stated Goal: return home with her spouse to assist PT Goal Formulation: With patient/family Time For Goal Achievement: 11/13/17 Potential to Achieve Goals: Good    Frequency 7X/week   Barriers to discharge        Co-evaluation               AM-PAC PT "6 Clicks" Daily Activity  Outcome Measure Difficulty turning over in bed (including adjusting bedclothes, sheets and blankets)?: None Difficulty moving from lying on back to sitting on the side of the bed? : None Difficulty sitting down on and standing up from a chair with arms (e.g., wheelchair, bedside commode, etc,.)?: A Little Help needed moving to and from a bed to chair (including a wheelchair)?: A Little Help needed walking in hospital room?: A Little Help needed climbing 3-5 steps with a  railing? : A Lot 6 Click Score: 19    End of Session   Activity Tolerance: Patient limited by fatigue;Patient tolerated treatment well(Patient limited secondary to dizziness/wavy vision) Patient left: in bed;with call bell/phone within reach;with family/visitor present(seated at bedside) Nurse Communication: Mobility status PT Visit Diagnosis: Unsteadiness on feet (R26.81);Other abnormalities of gait and mobility (R26.89);Muscle weakness (generalized) (M62.81)    Time: 0865-7846 PT Time Calculation (min) (ACUTE ONLY): 22 min   Charges:   PT Evaluation $PT Eval Moderate Complexity: 1 Mod PT Treatments $Therapeutic Activity: 8-22 mins   PT G Codes:        2:24 PM, 2017-12-05 Lonell Grandchild, MPT Physical Therapist with [facility] 336 [phone] office [phone] mobile phone

## 2017-11-06 NOTE — Plan of Care (Signed)
  Problem: Acute Rehab PT Goals(only PT should resolve) Goal: Patient Will Transfer Sit To/From Stand Outcome: Progressing Flowsheets (Taken 11/06/2017 1427) Patient will transfer sit to/from stand: with supervision Goal: Pt Will Transfer Bed To Chair/Chair To Bed Outcome: Progressing Flowsheets (Taken 11/06/2017 1427) Pt will Transfer Bed to Chair/Chair to Bed: with supervision Goal: Pt Will Ambulate Outcome: Progressing Flowsheets (Taken 11/06/2017 1427) Pt will Ambulate: 50 feet;with supervision;with cane  2:28 PM, 11/06/17 Lonell Grandchild, MPT Physical Therapist with [facility] 336 [phone] office [phone] mobile phone

## 2017-11-06 NOTE — Progress Notes (Signed)
Subjective: She says she feels a little better.  She came in because of increasing pain in her neck and weakness in her left arm.  Her boyfriend who is at bedside says that she was pale.  Apparently her daughters had some concern that she may have not taking her medication appropriately but she does not actually show any opiates in her urine drug screen.  She fell about a month ago and has been having more trouble since that.  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  Objective: Vital signs in last 24 hours: Temp:  [97.5 F (36.4 C)-99.7 F (37.6 C)] 97.5 F (36.4 C) (04/02 0855) Pulse Rate:  [77-106] 87 (04/02 0855) Resp:  [17-34] 34 (04/02 0230) BP: (123-151)/(74-101) 127/81 (04/02 0855) SpO2:  [93 %-100 %] 100 % (04/02 0855) Weight change:  Last BM Date: 11/05/17  Intake/Output from previous day: 04/01 0701 - 04/02 0700 In: 108.8 [I.V.:108.8] Out: -   PHYSICAL EXAM General appearance: alert, cooperative and no distress Resp: clear to auscultation bilaterally Cardio: regular rate and rhythm, S1, S2 normal, no murmur, click, rub or gallop GI: soft, non-tender; bowel sounds normal; no masses,  no organomegaly Extremities: extremities normal, atraumatic, no cyanosis or edema  Lab Results:  Results for orders placed or performed during the hospital encounter of 11/05/17 (from the past 48 hour(s))  Urine rapid drug screen (hosp performed)     Status: Abnormal   Collection Time: 11/05/17 11:20 PM  Result Value Ref Range   Opiates NONE DETECTED NONE DETECTED   Cocaine NONE DETECTED NONE DETECTED   Benzodiazepines POSITIVE (A) NONE DETECTED   Amphetamines NONE DETECTED NONE DETECTED   Tetrahydrocannabinol NONE DETECTED NONE DETECTED   Barbiturates NONE DETECTED NONE DETECTED    Comment: (NOTE) DRUG SCREEN FOR MEDICAL PURPOSES ONLY.  IF CONFIRMATION IS NEEDED FOR ANY PURPOSE, NOTIFY LAB WITHIN 5 DAYS. LOWEST DETECTABLE LIMITS FOR URINE DRUG SCREEN Drug Class                     Cutoff  (ng/mL) Amphetamine and metabolites    1000 Barbiturate and metabolites    200 Benzodiazepine                 762 Tricyclics and metabolites     300 Opiates and metabolites        300 Cocaine and metabolites        300 THC                            50 Performed at [facility], [address], [place], [place] [address]   Urinalysis, Routine w reflex microscopic     Status: Abnormal   Collection Time: 11/05/17 11:20 PM  Result Value Ref Range   Color, Urine YELLOW YELLOW   APPearance CLEAR CLEAR   Specific Gravity, Urine 1.008 1.005 - 1.030   pH 6.0 5.0 - 8.0   Glucose, UA NEGATIVE NEGATIVE mg/dL   Hgb urine dipstick MODERATE (A) NEGATIVE   Bilirubin Urine NEGATIVE NEGATIVE   Ketones, ur NEGATIVE NEGATIVE mg/dL   Protein, ur 100 (A) NEGATIVE mg/dL   Nitrite NEGATIVE NEGATIVE   Leukocytes, UA NEGATIVE NEGATIVE   RBC / HPF 0-5 0 - 5 RBC/hpf   WBC, UA 0-5 0 - 5 WBC/hpf   Bacteria, UA RARE (A) NONE SEEN   Squamous Epithelial / LPF 0-5 (A) NONE SEEN   Mucus PRESENT     Comment: Performed  at [facility], [address], [place], [place] [address]  Ethanol     Status: None   Collection Time: 11/05/17 11:32 PM  Result Value Ref Range   Alcohol, Ethyl (B) <10 <10 mg/dL    Comment:        LOWEST DETECTABLE LIMIT FOR SERUM ALCOHOL IS 10 mg/dL FOR MEDICAL PURPOSES ONLY Performed at [facility], [address] [place], [place] [address]   Protime-INR     Status: Abnormal   Collection Time: 11/05/17 11:32 PM  Result Value Ref Range   Prothrombin Time 15.4 (H) 11.4 - 15.2 seconds   INR 1.23     Comment: Performed at [facility], [address], [place], [place] [address]  APTT     Status: None   Collection Time: 11/05/17 11:32 PM  Result Value Ref Range   aPTT 32 24 - 36 seconds    Comment: Performed at [facility], [address], [place], [place] [address]  CBC     Status: Abnormal   Collection Time: 11/05/17 11:32 PM  Result Value Ref Range   WBC 17.8 (H) 4.0 - 10.5 K/uL    RBC 4.97 3.87 - 5.11 MIL/uL   Hemoglobin 16.1 (H) 12.0 - 15.0 g/dL   HCT 47.8 (H) 36.0 - 46.0 %   MCV 96.2 78.0 - 100.0 fL   MCH 32.4 26.0 - 34.0 pg   MCHC 33.7 30.0 - 36.0 g/dL   RDW 14.3 11.5 - 15.5 %   Platelets 333 150 - 400 K/uL    Comment: Performed at [facility], [address] [place], [place] [address]  Differential     Status: Abnormal   Collection Time: 11/05/17 11:32 PM  Result Value Ref Range   Neutrophils Relative % 64 %   Neutro Abs 11.4 (H) 1.7 - 7.7 K/uL   Lymphocytes Relative 24 %   Lymphs Abs 4.3 (H) 0.7 - 4.0 K/uL   Monocytes Relative 12 %   Monocytes Absolute 2.1 (H) 0.1 - 1.0 K/uL   Eosinophils Relative 0 %   Eosinophils Absolute 0.0 0.0 - 0.7 K/uL   Basophils Relative 0 %   Basophils Absolute 0.0 0.0 - 0.1 K/uL    Comment: Performed at [facility], [address] [place], [place] [address]  Comprehensive metabolic panel     Status: Abnormal   Collection Time: 11/05/17 11:32 PM  Result Value Ref Range   Sodium 140 135 - 145 mmol/L   Potassium 4.3 3.5 - 5.1 mmol/L   Chloride 102 101 - 111 mmol/L   CO2 24 22 - 32 mmol/L   Glucose, Bld 115 (H) 65 - 99 mg/dL   BUN 9 6 - 20 mg/dL   Creatinine, Ser 0.84 0.44 - 1.00 mg/dL   Calcium 10.0 8.9 - 10.3 mg/dL   Total Protein 8.7 (H) 6.5 - 8.1 g/dL   Albumin 4.0 3.5 - 5.0 g/dL   AST 20 15 - 41 U/L   ALT 10 (L) 14 - 54 U/L   Alkaline Phosphatase 127 (H) 38 - 126 U/L   Total Bilirubin 0.6 0.3 - 1.2 mg/dL   GFR calc non Af Amer >60 >60 mL/min   GFR calc Af Amer >60 >60 mL/min    Comment: (NOTE) The eGFR has been calculated using the CKD EPI equation. This calculation has not been validated in all clinical situations. eGFR's persistently <60 mL/min signify possible Chronic Kidney Disease.    Anion gap 14 5 - 15  Comment: Performed at [facility], [address], [place], [place] [address]  Troponin I     Status: None   Collection Time: 11/05/17 11:32 PM  Result Value Ref Range   Troponin I <0.03 <0.03  ng/mL    Comment: Performed at [facility], [address] [place], [place] [address]  Lipid panel     Status: Abnormal   Collection Time: 11/06/17  6:20 AM  Result Value Ref Range   Cholesterol 228 (H) 0 - 200 mg/dL   Triglycerides 66 <150 mg/dL   HDL 78 >40 mg/dL   Total CHOL/HDL Ratio 2.9 RATIO   VLDL 13 0 - 40 mg/dL   LDL Cholesterol 137 (H) 0 - 99 mg/dL    Comment:        Total Cholesterol/HDL:CHD Risk Coronary Heart Disease Risk Table                     Men   Women  1/2 Average Risk   3.4   3.3  Average Risk       5.0   4.4  2 X Average Risk   9.6   7.1  3 X Average Risk  23.4   11.0        Use the calculated Patient Ratio above and the CHD Risk Table to determine the patient's CHD Risk.        ATP III CLASSIFICATION (LDL):  <100     mg/dL   Optimal  100-129  mg/dL   Near or Above                    Optimal  130-159  mg/dL   Borderline  160-189  mg/dL   High  >190     mg/dL   Very High Performed at [facility], [address], [place],  [address]   Glucose, capillary     Status: None   Collection Time: 11/06/17  8:42 AM  Result Value Ref Range   Glucose-Capillary 93 65 - 99 mg/dL    ABGS No results for input(s): PHART, PO2ART, TCO2, HCO3 in the last 72 hours.  Invalid input(s): PCO2 CULTURES No results found for this or any previous visit (from the past 240 hour(s)). Studies/Results: Dg Chest 2 View  Result Date: 11/06/2017 CLINICAL DATA:  Cough and shortness of breath for 2 weeks. Chest pain for 2-3 days. History of COPD. EXAM: CHEST - 2 VIEW COMPARISON:  Chest radiograph June 21, 2017 FINDINGS: Diffuse interstitial prominence similar to prior radiograph. No pleural effusion or focal consolidation. Cardiomediastinal silhouette is normal. Calcified aortic arch. No pneumothorax. ACDF. Osteopenia. IMPRESSION: Chronic interstitial changes without focal consolidation. Electronically Signed   By: Elon Alas M.D.   On: 11/06/2017 02:10   Ct Head Wo  Contrast  Result Date: 11/06/2017 CLINICAL DATA:  Neck pain, fall and hit head EXAM: CT HEAD WITHOUT CONTRAST CT CERVICAL SPINE WITHOUT CONTRAST TECHNIQUE: Multidetector CT imaging of the head and cervical spine was performed following the standard protocol without intravenous contrast. Multiplanar CT image reconstructions of the cervical spine were also generated. COMPARISON:  CT brain 10/05/2017, CT brain and cervical spine 08/12/2017, MRI brain 03/28/2016 FINDINGS: CT HEAD FINDINGS Brain: No acute territorial infarction, hemorrhage or intracranial mass is visualized. Stable enlargement of the lateral and third ventricles with normal size fourth ventricle. Vascular: No hyperdense vessels.  Carotid vascular calcification Skull: Normal. Negative for fracture or focal lesion. Sinuses/Orbits: No acute finding. Other: None CT CERVICAL  SPINE FINDINGS Alignment: Straightening of the cervical spine. No subluxation. Facet alignment within normal limits. Skull base and vertebrae: No acute fracture. No primary bone lesion or focal pathologic process. Soft tissues and spinal canal: No prevertebral fluid or swelling. No visible canal hematoma. Disc levels: Anterior plate and screw fixation C4 through C6. Mild degenerative changes at C6-C7. Upper chest: Negative. Other: None IMPRESSION: 1. No CT evidence for acute intracranial abnormality. Stable moderate lateral and third ventricular enlargement. 2. Straightening of the cervical spine with postoperative changes C4 through C6. No definite acute osseous abnormality. Electronically Signed   By: Donavan Foil M.D.   On: 11/06/2017 00:20   Ct Cervical Spine Wo Contrast  Result Date: 11/06/2017 CLINICAL DATA:  Neck pain, fall and hit head EXAM: CT HEAD WITHOUT CONTRAST CT CERVICAL SPINE WITHOUT CONTRAST TECHNIQUE: Multidetector CT imaging of the head and cervical spine was performed following the standard protocol without intravenous contrast. Multiplanar CT image  reconstructions of the cervical spine were also generated. COMPARISON:  CT brain 10/05/2017, CT brain and cervical spine 08/12/2017, MRI brain 03/28/2016 FINDINGS: CT HEAD FINDINGS Brain: No acute territorial infarction, hemorrhage or intracranial mass is visualized. Stable enlargement of the lateral and third ventricles with normal size fourth ventricle. Vascular: No hyperdense vessels.  Carotid vascular calcification Skull: Normal. Negative for fracture or focal lesion. Sinuses/Orbits: No acute finding. Other: None CT CERVICAL SPINE FINDINGS Alignment: Straightening of the cervical spine. No subluxation. Facet alignment within normal limits. Skull base and vertebrae: No acute fracture. No primary bone lesion or focal pathologic process. Soft tissues and spinal canal: No prevertebral fluid or swelling. No visible canal hematoma. Disc levels: Anterior plate and screw fixation C4 through C6. Mild degenerative changes at C6-C7. Upper chest: Negative. Other: None IMPRESSION: 1. No CT evidence for acute intracranial abnormality. Stable moderate lateral and third ventricular enlargement. 2. Straightening of the cervical spine with postoperative changes C4 through C6. No definite acute osseous abnormality. Electronically Signed   By: Donavan Foil M.D.   On: 11/06/2017 00:20   Mr Jodene Nam Neck W Wo Contrast  Result Date: 11/06/2017 CLINICAL DATA:  62 year old female with headache and neck pain, dizziness, weakness and abnormal lower extremity sensation. EXAM: MRA NECK WITHOUT AND WITH CONTRAST TECHNIQUE: Multiplanar and multiecho pulse sequences of the neck were obtained without and with intravenous contrast. Angiographic images of the neck were obtained using MRA technique without and with intravenous contrast. CONTRAST:  94m MULTIHANCE GADOBENATE DIMEGLUMINE 529 MG/ML IV SOLN COMPARISON:  Brain MRI and intracranial MRA today reported separately. Cervical spine CT 11/05/2017 and earlier. FINDINGS: Precontrast  time-of-flight imaging reveals antegrade flow in both cervical carotid and vertebral arteries. There is bilateral carotid and vertebral artery tortuosity. The left vertebral artery is mildly dominant. Both carotid bifurcations are patent. Post-contrast neck MRA imaging reveals a 3 vessel arch configuration. No evidence of great vessel origin stenosis. The right CCA appears within normal limits. The right carotid bifurcation is normal. There is moderate tortuosity of the cervical right ICA which otherwise appears normal. Negative visible right ICA siphon and anterior circulation. The left CCA is within normal limits. The left carotid bifurcation is normal. Mild to moderate tortuosity of the cervical left ICA which otherwise appears normal. Negative visible left ICA siphon. There may be a fetal type left PCA origin. Both vertebral artery origins are partially obscured. Both vertebral arteries are otherwise patent to the vertebrobasilar junction with tortuosity but no stenosis. The left vertebral artery is somewhat dominant. Unremarkable proximal posterior  circulation. IMPRESSION: Negative neck MRA aside from mild to moderate generalized carotid and vertebral artery tortuosity. Electronically Signed   By: Genevie Ann M.D.   On: 11/06/2017 08:29   Mr Brain Wo Contrast  Result Date: 11/06/2017 CLINICAL DATA:  62 year old female with headache and neck pain, dizziness, weakness and abnormal lower extremity sensation. EXAM: MRI HEAD WITHOUT CONTRAST TECHNIQUE: Multiplanar, multiecho pulse sequences of the brain and surrounding structures were obtained without intravenous contrast. COMPARISON:  Head and cervical Spine CT without contrast 11/05/2017, and earlier. Brain MRI 03/28/2016, 06/28/2012 FINDINGS: Brain: No restricted diffusion or evidence of acute infarction. Stable gray and white matter signal throughout the brain, normal for age aside from chronic subtle cortical T2 and FLAIR hyperintensity in the left superior  frontal gyrus (series 12, image 42) which is nonspecific but favored to be chronic encephalomalacia. No chronic cerebral blood products. Chronic ventriculomegaly is stable since 2013 and favored to reflect congenital or chronic aqueductal stenosis. No transependymal edema. No midline shift, mass effect, evidence of mass lesion, extra-axial collection or acute intracranial hemorrhage. Cervicomedullary junction and pituitary are within normal limits. Vascular: Major intracranial vascular flow voids are stable since 2017 and preserved. The distal left vertebral artery appears mildly dominant. Skull and upper cervical spine: Negative visible cervical spine. Visualized bone marrow signal is within normal limits. Sinuses/Orbits: Stable and negative. Other: Mastoids remain clear. Visible internal auditory structures appear stable and unremarkable. Scalp and face soft tissues appear negative. IMPRESSION: 1.  No acute intracranial abnormality. 2. Continued stable MRI appearance the brain. 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 Chronic mild nonspecific signal changes in the cortex of the left superior frontal gyrus. 3. Intracranial MRA today is reported separately. Electronically Signed   By: Genevie Ann M.D.   On: 11/06/2017 08:26   Mr Jodene Nam Head Wo Contrast  Result Date: 11/06/2017 CLINICAL DATA:  62 year old female with headache and neck pain, dizziness, weakness and abnormal lower extremity sensation. EXAM: MRA HEAD WITHOUT CONTRAST TECHNIQUE: Angiographic images of the Circle of Willis were obtained using MRA technique without intravenous contrast. COMPARISON:  Neck MRA and brain MRI today reported separately. FINDINGS: Antegrade flow in the posterior circulation with dominant distal left vertebral artery. No distal vertebral artery stenosis. Patent PICA origins. Patent vertebrobasilar junction. Normal AICA origins. Patent basilar artery without stenosis. Patent SCA origins. Normal right PCA origin.  Fetal type left PCA origin. The right posterior communicating artery is also present. There is signal loss in the bilateral PCA P1/P2 segment which seems to correspond to moderate stenoses on the post-contrast neck MRA images today. Otherwise, allowing for mild artifact the bilateral PCA branches are within normal limits. Antegrade flow in both ICA siphons. No siphon stenosis. Ophthalmic and posterior communicating artery origins are normal. Patent carotid termini. Normal right ACA origin. There is mild to moderate stenosis at the left ACA origin, probably mild this was not evident on post-contrast neck MRA images today. The anterior communicating artery and visible ACA branches are within normal limits. Mild stenosis at the right MCA origin, also visible on the Neck MRA images today. The right MCA M1 segment, right MCA bifurcation visible right MCA branches are within normal limits. The left MCA origins normal, but there is a severe stenosis of the left MCA M1 segment at or just proximal to the bifurcation, which is also evident on the post-contrast neck MRA images today. Despite this, the left MCA branches appear patent and fairly symmetric to those on the right. IMPRESSION: 1. Intracranial  MRA is positive for significant intracranial atherosclerosis and stenosis: - Severe stenosis of the Left MCA distal M1 segment. The left MCA branches remain patent. - Moderate stenoses in the bilateral PCA P1/P2 segments. - Mild stenoses at the Left ACA and Right MCA origins. 2. Brain MRI today is reported separately. Electronically Signed   By: Genevie Ann M.D.   On: 11/06/2017 08:37    Medications:  Prior to Admission:  Medications Prior to Admission  Medication Sig Dispense Refill Last Dose  . acetaminophen-codeine (TYLENOL #3) 300-30 MG tablet Take 1 tablet by mouth 4 (four) times daily as needed for moderate pain.   Past Week at Unknown time  . amLODipine (NORVASC) 5 MG tablet Take 1 tablet (5 mg total) by mouth daily.  14 tablet 0 Past Week at Unknown time  . Aspirin-Salicylamide-Caffeine (BC HEADACHE POWDER PO) Take 1 packet by mouth daily as needed. For pain   11/04/2017 at Unknown time  . busPIRone (BUSPAR) 10 MG tablet Take 10 mg by mouth 2 (two) times daily.   11/05/2017 at Unknown time  . diazepam (VALIUM) 10 MG tablet TAKE ONE TABLET BY MOUTH TWICE DAILY  5 11/05/2017 at Unknown time  . Dihydrotachysterol (DHT PO) Take 1 capsule by mouth daily.   11/05/2017 at Unknown time  . omeprazole (PRILOSEC) 20 MG capsule Take 20 mg by mouth daily.  6 11/05/2017 at Unknown time  . PROAIR HFA 108 (90 Base) MCG/ACT inhaler INHALE TWO PUFFS BY MOUTH 4 TIMES DAILY AS NEEDED FOR SHORTNESS OF BREATH  12 unknown  . traZODone (DESYREL) 50 MG tablet TAKE ONE TABLET BY MOUTH AT BEDTIME  5 unknown  . cephALEXin (KEFLEX) 500 MG capsule Take 1 capsule (500 mg total) by mouth 4 (four) times daily. (Patient not taking: Reported on 11/05/2017) 20 capsule 0 Not Taking at Unknown time   Scheduled: . aspirin  300 mg Rectal Daily   Or  . aspirin  325 mg Oral Daily  . enoxaparin (LOVENOX) injection  40 mg Subcutaneous Q24H  . nicotine  14 mg Transdermal Daily   Continuous: . lactated ringers 75 mL/hr at 11/06/17 0310   AUQ:[id number] **OR** acetaminophen, guaiFENesin-dextromethorphan, ipratropium-albuterol, ketorolac, ondansetron **OR** ondansetron (ZOFRAN) IV  Assesment: She was admitted with neck pain and left arm weakness.  She has chronic changes in her brain from previous CVA.  She has chronic changes in her neck from surgery. Principal Problem:   Hemiparesis ([place]) Active Problems:   Altered mental status   Cerebral ventriculomegaly   Depression   Neck pain   COPD (chronic obstructive pulmonary disease) ([facility])   Tobacco abuse   CVA (cerebral vascular accident) ([place])    Plan: Continue treatments.  PT consult.    LOS: 0 days   Adelei Scobey L 11/06/2017, 9:05 AM

## 2017-11-06 NOTE — Plan of Care (Signed)
  Problem: Acute Rehab OT Goals (only OT should resolve) Goal: Pt. Will Perform Grooming Flowsheets (Taken 11/06/2017 0940) Pt Will Perform Grooming: with modified independence;standing Goal: Pt. Will Perform Upper Body Dressing Flowsheets (Taken 11/06/2017 0940) Pt Will Perform Upper Body Dressing: with modified independence;sitting Goal: Pt. Will Transfer To Toilet Flowsheets (Taken 11/06/2017 0940) Pt Will Transfer to Toilet: with modified independence;ambulating;regular height toilet;bedside commode Goal: Pt. Will Perform Toileting-Clothing Manipulation Flowsheets (Taken 11/06/2017 0940) Pt Will Perform Toileting - Clothing Manipulation and hygiene: with modified independence;sitting/lateral leans;sit to/from stand Goal: Pt/Caregiver Will Perform Home Exercise Program Flowsheets (Taken 11/06/2017 0940) Pt/caregiver will Perform Home Exercise Program: Increased ROM;Increased strength;Both right and left upper extremity;Independently;With written HEP provided

## 2017-11-07 DIAGNOSIS — R269 Unspecified abnormalities of gait and mobility: Secondary | ICD-10-CM | POA: Diagnosis not present

## 2017-11-07 LAB — HIV ANTIBODY (ROUTINE TESTING W REFLEX): HIV Screen 4th Generation wRfx: NONREACTIVE

## 2017-11-07 LAB — GLUCOSE, CAPILLARY: Glucose-Capillary: 99 mg/dL (ref 65–99)

## 2017-11-07 MED ORDER — DIAZEPAM 10 MG PO TABS: 5.0000 mg | ORAL_TABLET | Freq: Two times a day (BID) | ORAL | 5 refills | Status: DC

## 2017-11-07 NOTE — Care Management Note (Signed)
Case Management Note  Patient Details  Name: Lindsey Howard MRN: [id number] Date of Birth: Nov 10, 1955  Subjective/Objective:       Admitted with hemiparesis. Pt from home, lives with S/O. Ind with ADL's pta. Has PCP, transportation and insurance with drug coverage.              Action/Plan: DC home today with [place] referral and RW. Pt has chosen [facility] from list of [facility] providers. Juliann Pulse, [facility] rep, aware of referral and will pull pt info from chart and deliver DME to pt room prior to DC. Pt aware HH has 48 hrs to make first visit.   Expected Discharge Date:  11/07/17               Expected Discharge Plan:  [place]  In-House Referral:  NA  Discharge planning Services  CM Consult  Post Acute Care Choice:  Home Health Choice offered to:  Patient  DME Arranged:  [place] rolling DME Agency:  [place].  Status of Service:  Completed, signed off  Sherald Barge, RN 11/07/2017, 11:25 AM

## 2017-11-07 NOTE — Evaluation (Signed)
Speech Language Pathology Evaluation Patient Details Name: Lindsey Howard MRN: [id number] DOB: 1955-10-08 Today's Date: 11/07/2017 Time: 8250-5397 SLP Time Calculation (min) (ACUTE ONLY): 22 min  Problem List:  Patient Active Problem List   Diagnosis Date Noted  . Hemiparesis ([place]) 11/06/2017  . Neck pain 11/06/2017  . COPD (chronic obstructive pulmonary disease) ([place]) 11/06/2017  . Tobacco abuse 11/06/2017  . CVA (cerebral vascular accident) ([place]) 11/06/2017  . Malnutrition of moderate degree 03/29/2016  . Slurred speech 03/27/2016  . Altered mental status 06/29/2012  . Cerebral ventriculomegaly 06/29/2012  . Depression 06/29/2012   Past Medical History:  Past Medical History:  Diagnosis Date  . 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 Chronic back pain   . Chronic neck pain   . COPD (chronic obstructive pulmonary disease) ([place])   . Depression   . Esophagitis   . GERD (gastroesophageal reflux disease)   . High cholesterol   . Hypertension   . Panic attacks   . Sciatica   . Speech abnormality    chronic  . Stroke [facility])    Past Surgical History:  Past Surgical History:  Procedure Laterality Date  . BACK SURGERY    . NECK SURGERY    . NEPHRECTOMY Left 1978   s/p trauma  . OOPHORECTOMY    . SPLENECTOMY, TOTAL  1978   s/p trauma   HPI:  Lindsey Howard is a 62 y.o. female with medical history significant for depression/anxiety, hypertension, and COPD with ongoing tobacco abuse who states that she has had persistent neck pain ever since she fell and hit her head on 3/1 with no subsequent falls since this incident..  She has had some worsening episodes of dizziness and states that she has also had some trouble ambulating with heaviness in her legs.  This appears to have started approximately 2 days ago and she also noted some significant weakness to her left arm.  She called her 2 daughters who noted at home that  patient was on multiple sedating medications including Valium and trazodone which were apparently mixed together and the patient does not know how she is taking them.  Daughters at bedside state that patient at baseline does have speech difficulty. Pt with dysarthria at baseline.   Assessment / Plan / Recommendation Clinical Impression  SLE completed in room. Pt with baseline motor speech impairment resulting in reduced speech intelligiblity at the sentence and conversation level. Basic cognitive and language skills appear to be WNL. No family is present at this time to confirm baseline dysarthria, however chart review reveals that family does endorse baseline dysarthria. Pt lives at home with her significant other, does not work or drive, and occasionally needs reminders for medication management. Pt became tearful when discussing her baseline "speech impediment" and tells SLP she was "born with it". No further SLP services indicated at this time. SLP will sign off.     SLP Assessment  SLP Recommendation/Assessment: Patient does not need any further Speech Lanaguage Pathology Services SLP Visit Diagnosis: Dysarthria and anarthria (R47.1)    Follow Up Recommendations  None    Frequency and Duration           SLP Evaluation Cognition  Overall Cognitive Status: Within Functional Limits for tasks assessed Arousal/Alertness: Awake/alert Orientation Level: Oriented X4 Memory: Appears intact Awareness: Appears intact Problem Solving: Appears intact Behaviors: (Pt became tearful discussing her "speech impairment") Safety/Judgment: Appears intact  Comprehension  Auditory Comprehension Overall Auditory Comprehension: Appears within functional limits for tasks assessed Yes/No Questions: Within Functional Limits Commands: Within Functional Limits Conversation: Complex Visual Recognition/Discrimination Discrimination: Within Function Limits Reading Comprehension Reading Status: Not  tested    Expression Expression Primary Mode of Expression: Verbal Verbal Expression Overall Verbal Expression: Appears within functional limits for tasks assessed Initiation: No impairment Automatic Speech: Name;Social Response;Day of week Level of Generative/Spontaneous Verbalization: Conversation Repetition: No impairment Naming: No impairment Pragmatics: No impairment Interfering Components: Speech intelligibility Effective Techniques: Articulatory cues Non-Verbal Means of Communication: Not applicable Written Expression Dominant Hand: Left Written Expression: Not tested   Oral / Motor  Oral Motor/Sensory Function Overall Oral Motor/Sensory Function: Mild impairment(premorbid reduced coordination) Facial ROM: Within Functional Limits Facial Symmetry: Within Functional Limits Facial Strength: Within Functional Limits Facial Sensation: Within Functional Limits Lingual ROM: Within Functional Limits Lingual Symmetry: Within Functional Limits Lingual Strength: Within Functional Limits Lingual Sensation: Within Functional Limits Velum: Within Functional Limits Mandible: Within Functional Limits Motor Speech Overall Motor Speech: Impaired at baseline Respiration: Within functional limits Phonation: Normal Resonance: Hyponasality Articulation: Impaired Level of Impairment: Conversation Intelligibility: Intelligibility reduced Word: 75-100% accurate Phrase: 75-100% accurate Sentence: 50-74% accurate Conversation: 50-74% accurate Motor Planning: Witnin functional limits Motor Speech Errors: Aware;Inconsistent Interfering Components: Premorbid status;Inadequate dentition Effective Techniques: Slow rate   Thank you,  Genene Churn, [place]                     [place] 11/07/2017, 12:04 PM

## 2017-11-07 NOTE — Progress Notes (Signed)
Occupational Therapy Treatment Patient Details Name: Lindsey Howard MRN: [id number] DOB: March 16, 1956 Today's Date: 11/07/2017    History of present illness Lindsey Howard is a 62 y.o. female with medical history significant for depression/anxiety, hypertension, and COPD with ongoing tobacco abuse who states that she has had persistent neck pain ever since she fell and hit her head on 3/1 with no subsequent falls since this incident..  She has had some worsening episodes of dizziness and states that she has also had some trouble ambulating with heaviness in her legs.  This appears to have started approximately 2 days ago and she also noted some significant weakness to her left arm.  She called her 2 daughters who noted at home that patient was on multiple sedating medications including Valium and trazodone which were apparently mixed together and the patient does not know how she is taking them.  Daughters at bedside state that patient at baseline does have speech difficulty and does stutter.   OT comments  Pt received supine in bed, agreeable to OT evaluation. Pt reports dizziness is slightly improved today and pain is less, OT notes improvement in balance with functional mobility. Pt continues to move slowly and carefully during ADL tasks. Pt able to complete standing grooming/sponge bathing tasks at sink with supervision, no LOB. Coordination appears WFL. Pt does have difficulty reaching about 50% range during grooming tasks, requiring assistance with hair brushing and reaching behind back. Discharge to SNF remains appropriate.    Follow Up Recommendations  Home health OT    Equipment Recommendations  Tub/shower seat       Precautions / Restrictions Precautions Precautions: Fall Restrictions Weight Bearing Restrictions: No       Mobility Bed Mobility Overal bed mobility: Modified Independent Bed Mobility: Supine to Sit     Supine to sit: Modified independent (Device/Increase time)      General bed mobility comments: uses siderail to pull self up for supine to sitting  Transfers Overall transfer level: Needs assistance Equipment used: Straight cane;1 person hand held assist Transfers: Sit to/from [facility] Sit to Stand: Min assist Stand pivot transfers: Min assist                ADL either performed or assessed with clinical judgement   ADL Overall ADL's : Needs assistance/impaired     Grooming: Wash/dry hands;Wash/dry face;Oral care;Supervision/safety;Standing;Brushing hair;Moderate assistance;Sitting Grooming Details (indicate cue type and reason): Pt able to perform tasks at sink with supervision, no LOB. Requiring assistance with hair brushing due to neck and shoulder pain with ROM > 90 degrees. Pt able to reach front of hair, unable to reach back.  Upper Body Bathing: Supervision/ safety;Standing Upper Body Bathing Details (indicate cue type and reason): Pt washed arms and neck while standing at sink                         Functional mobility during ADLs: Min guard;Cane       Vision   Vision Assessment?: No apparent visual deficits          Cognition Arousal/Alertness: Awake/alert Behavior During Therapy: WFL for tasks assessed/performed Overall Cognitive Status: Within Functional Limits for tasks assessed  Pertinent Vitals/ Pain       Pain Assessment: 0-10 Pain Score: 4  Pain Location: neck Pain Descriptors / Indicators: Sharp;Shooting Pain Intervention(s): Limited activity within patient's tolerance;Monitored during session;Repositioned         Frequency  Min 2X/week        Progress Toward Goals  OT Goals(current goals can now be found in the care plan section)  Progress towards OT goals: Progressing toward goals  Acute Rehab OT Goals Patient Stated Goal: to return home and have less pain OT Goal Formulation: With  patient Time For Goal Achievement: 11/20/17 Potential to Achieve Goals: Good ADL Goals Pt Will Perform Grooming: with modified independence;standing Pt Will Perform Upper Body Dressing: with modified independence;sitting Pt Will Transfer to Toilet: with modified independence;ambulating;regular height toilet;bedside commode Pt Will Perform Toileting - Clothing Manipulation and hygiene: with modified independence;sitting/lateral leans;sit to/from stand Pt/caregiver will Perform Home Exercise Program: Increased ROM;Increased strength;Both right and left upper extremity;Independently;With written HEP provided  Plan Discharge plan remains appropriate       AM-PAC PT "6 Clicks" Daily Activity     Outcome Measure   Help from another person eating meals?: None Help from another person taking care of personal grooming?: A Little Help from another person toileting, which includes using toliet, bedpan, or urinal?: A Little Help from another person bathing (including washing, rinsing, drying)?: A Little Help from another person to put on and taking off regular upper body clothing?: A Little Help from another person to put on and taking off regular lower body clothing?: A Little 6 Click Score: 19    End of Session Equipment Utilized During Treatment: Other (comment)(SPC)  OT Visit Diagnosis: Muscle weakness (generalized) (M62.81);Pain Pain - Right/Left: Left Pain - part of body: Shoulder(neck)   Activity Tolerance Patient tolerated treatment well   Patient Left in chair;with call bell/phone within reach   Nurse Communication          Time: 5102-5852 OT Time Calculation (min): 28 min  Charges: OT General Charges $OT Visit: 1 Visit OT Treatments $Self Care/Home Management : 23-37 mins    Guadelupe Sabin, OTR/L  [phone] 11/07/2017, 8:19 AM

## 2017-11-07 NOTE — Progress Notes (Signed)
Patient's IV removed, patient tolerated well, reviewed AVS with patient and patient's husband, both verbalized understanding. RX given to patient for Valium.  Patient taken taken downstairs via wheelchair and transported home by husband.

## 2017-11-07 NOTE — Progress Notes (Signed)
Physical Therapy Treatment Patient Details Name: Lindsey Howard MRN: [id number] DOB: 1956/07/29 Today's Date: 11/07/2017    History of Present Illness Lindsey Howard is a 62 y.o. female with medical history significant for depression/anxiety, hypertension, and COPD with ongoing tobacco abuse who states that she has had persistent neck pain ever since she fell and hit her head on 3/1 with no subsequent falls since this incident..  She has had some worsening episodes of dizziness and states that she has also had some trouble ambulating with heaviness in her legs.  This appears to have started approximately 2 days ago and she also noted some significant weakness to her left arm.  She called her 2 daughters who noted at home that patient was on multiple sedating medications including Valium and trazodone which were apparently mixed together and the patient does not know how she is taking them.  Daughters at bedside state that patient at baseline does have speech difficulty and does stutter.    PT Comments    Patient very unsteady on feet demonstrating slow labored cadence when using cane, had near fall prevented by writer, had patient use RW to ambulate back to room.  Patient states she has standard walker at home that is to wide for her.  Case manager notified to order patient a RW for home use.  Patient encouraged to use RW at home for safety and ask for help when needed with understanding acknowledged.  Patient will benefit from continued physical therapy in hospital and recommended venue below to increase strength, balance, endurance for safe ADLs and gait.    Follow Up Recommendations  Home health PT;Supervision for mobility/OOB     Equipment Recommendations  Rolling walker with 5" wheels    Recommendations for Other Services       Precautions / Restrictions Precautions Precautions: Fall Restrictions Weight Bearing Restrictions: No    Mobility  Bed Mobility                General bed mobility comments: Patient present up in chair   Transfers Overall transfer level: Needs assistance Equipment used: Straight cane;Rolling walker (2 wheeled) Transfers: Sit to/from Stand;Stand Pivot Transfers Sit to Stand: Min assist Stand pivot transfers: Min assist          Ambulation/Gait Ambulation/Gait assistance: Min assist Ambulation Distance (Feet): 20 Feet Assistive device: Straight cane;Rolling walker (2 wheeled) Gait Pattern/deviations: Decreased step length - right;Decreased step length - left;Decreased stride length Gait velocity: decreased Gait velocity interpretation: Below normal speed for age/gender General Gait Details: Patient had near fall prevent by writer using cane in hallway, very unsteady on feet, apprehensive due to extreme fear of falling, had patient use RW to ambulate back to room with mostly supervision.   Stairs            Wheelchair Mobility    Modified Rankin (Stroke Patients Only)       Balance Overall balance assessment: Needs assistance Sitting-balance support: Feet supported;No upper extremity supported Sitting balance-Leahy Scale: Good     Standing balance support: Single extremity supported;During functional activity Standing balance-Leahy Scale: Poor Standing balance comment: fair/poor with cane, fair with RW                            Cognition Arousal/Alertness: Awake/alert Behavior During Therapy: WFL for tasks assessed/performed Overall Cognitive Status: Within Functional Limits for tasks assessed  Exercises      General Comments        Pertinent Vitals/Pain Pain Assessment: 0-10 Pain Score: 7  Pain Location: posterior neck Pain Descriptors / Indicators: Sharp;Shooting Pain Intervention(s): Limited activity within patient's tolerance;Monitored during session    Home Living     Available Help at Discharge: Family;Available 24  hours/day Type of Home: Mobile home              Prior Function            PT Goals (current goals can now be found in the care plan section) Acute Rehab PT Goals Patient Stated Goal: to return home and have less pain PT Goal Formulation: With patient Time For Goal Achievement: 11/13/17 Potential to Achieve Goals: Good Progress towards PT goals: Progressing toward goals    Frequency    7X/week      PT Plan Current plan remains appropriate    Co-evaluation              AM-PAC PT "6 Clicks" Daily Activity  Outcome Measure  Difficulty turning over in bed (including adjusting bedclothes, sheets and blankets)?: None Difficulty moving from lying on back to sitting on the side of the bed? : None Difficulty sitting down on and standing up from a chair with arms (e.g., wheelchair, bedside commode, etc,.)?: A Little Help needed moving to and from a bed to chair (including a wheelchair)?: A Little Help needed walking in hospital room?: A Little Help needed climbing 3-5 steps with a railing? : A Lot 6 Click Score: 19    End of Session   Activity Tolerance: Patient limited by fatigue;Patient tolerated treatment well Patient left: in chair;with call bell/phone within reach Nurse Communication: Mobility status;Other (comment)(RN notified that patient had near fall using cane and encouraged to ask for help when walking to bathroom) PT Visit Diagnosis: Unsteadiness on feet (R26.81);Other abnormalities of gait and mobility (R26.89);Muscle weakness (generalized) (M62.81)     Time: 1050-1105 PT Time Calculation (min) (ACUTE ONLY): 15 min  Charges:  $Gait Training: 8-22 mins                    G Codes:       12:11 PM, 2017-11-25 Lonell Grandchild, MPT Physical Therapist with [facility] [phone] office [phone] mobile phone

## 2017-11-07 NOTE — Progress Notes (Signed)
Subjective: She says she feels better and wants to go home.  Objective: Vital signs in last 24 hours: Temp:  [97.5 F (36.4 C)-98.6 F (37 C)] 98.6 F (37 C) (04/03 0655) Pulse Rate:  [76-90] 81 (04/03 0655) Resp:  [16-20] 16 (04/03 0655) BP: (112-131)/(62-89) 128/84 (04/03 0655) SpO2:  [94 %-100 %] 95 % (04/03 0655) Weight change:  Last BM Date: 11/05/17  Intake/Output from previous day: 04/02 0701 - 04/03 0700 In: 480 [P.O.:480] Out: -   PHYSICAL EXAM General appearance: alert, cooperative and no distress Resp: clear to auscultation bilaterally Cardio: regular rate and rhythm, S1, S2 normal, no murmur, click, rub or gallop GI: soft, non-tender; bowel sounds normal; no masses,  no organomegaly Extremities: extremities normal, atraumatic, no cyanosis or edema  Lab Results:  Results for orders placed or performed during the hospital encounter of 11/05/17 (from the past 48 hour(s))  Urine rapid drug screen (hosp performed)     Status: Abnormal   Collection Time: 11/05/17 11:20 PM  Result Value Ref Range   Opiates NONE DETECTED NONE DETECTED   Cocaine NONE DETECTED NONE DETECTED   Benzodiazepines POSITIVE (A) NONE DETECTED   Amphetamines NONE DETECTED NONE DETECTED   Tetrahydrocannabinol NONE DETECTED NONE DETECTED   Barbiturates NONE DETECTED NONE DETECTED    Comment: (NOTE) DRUG SCREEN FOR MEDICAL PURPOSES ONLY.  IF CONFIRMATION IS NEEDED FOR ANY PURPOSE, NOTIFY LAB WITHIN 5 DAYS. LOWEST DETECTABLE LIMITS FOR URINE DRUG SCREEN Drug Class                     Cutoff (ng/mL) Amphetamine and metabolites    1000 Barbiturate and metabolites    200 Benzodiazepine                 962 Tricyclics and metabolites     300 Opiates and metabolites        300 Cocaine and metabolites        300 THC                            50 Performed at [facility], [address], [place], [place] [address]   Urinalysis, Routine w reflex microscopic     Status: Abnormal   Collection  Time: 11/05/17 11:20 PM  Result Value Ref Range   Color, Urine YELLOW YELLOW   APPearance CLEAR CLEAR   Specific Gravity, Urine 1.008 1.005 - 1.030   pH 6.0 5.0 - 8.0   Glucose, UA NEGATIVE NEGATIVE mg/dL   Hgb urine dipstick MODERATE (A) NEGATIVE   Bilirubin Urine NEGATIVE NEGATIVE   Ketones, ur NEGATIVE NEGATIVE mg/dL   Protein, ur 100 (A) NEGATIVE mg/dL   Nitrite NEGATIVE NEGATIVE   Leukocytes, UA NEGATIVE NEGATIVE   RBC / HPF 0-5 0 - 5 RBC/hpf   WBC, UA 0-5 0 - 5 WBC/hpf   Bacteria, UA RARE (A) NONE SEEN   Squamous Epithelial / LPF 0-5 (A) NONE SEEN   Mucus PRESENT     Comment: Performed at [facility], [address] [place], [place] [address]  Ethanol     Status: None   Collection Time: 11/05/17 11:32 PM  Result Value Ref Range   Alcohol, Ethyl (B) <10 <10 mg/dL    Comment:        LOWEST DETECTABLE LIMIT FOR SERUM ALCOHOL IS 10 mg/dL FOR MEDICAL PURPOSES ONLY Performed at [facility], [address] [place], [place] [address]   Protime-INR  Status: Abnormal   Collection Time: 11/05/17 11:32 PM  Result Value Ref Range   Prothrombin Time 15.4 (H) 11.4 - 15.2 seconds   INR 1.23     Comment: Performed at [facility], [address] [place], [place] [address]  APTT     Status: None   Collection Time: 11/05/17 11:32 PM  Result Value Ref Range   aPTT 32 24 - 36 seconds    Comment: Performed at [facility], [address] [place], [place] [address]  CBC     Status: Abnormal   Collection Time: 11/05/17 11:32 PM  Result Value Ref Range   WBC 17.8 (H) 4.0 - 10.5 K/uL   RBC 4.97 3.87 - 5.11 MIL/uL   Hemoglobin 16.1 (H) 12.0 - 15.0 g/dL   HCT 47.8 (H) 36.0 - 46.0 %   MCV 96.2 78.0 - 100.0 fL   MCH 32.4 26.0 - 34.0 pg   MCHC 33.7 30.0 - 36.0 g/dL   RDW 14.3 11.5 - 15.5 %   Platelets 333 150 - 400 K/uL    Comment: Performed at [facility], [address], [place], [place] [address]  Differential     Status: Abnormal   Collection Time: 11/05/17 11:32 PM  Result  Value Ref Range   Neutrophils Relative % 64 %   Neutro Abs 11.4 (H) 1.7 - 7.7 K/uL   Lymphocytes Relative 24 %   Lymphs Abs 4.3 (H) 0.7 - 4.0 K/uL   Monocytes Relative 12 %   Monocytes Absolute 2.1 (H) 0.1 - 1.0 K/uL   Eosinophils Relative 0 %   Eosinophils Absolute 0.0 0.0 - 0.7 K/uL   Basophils Relative 0 %   Basophils Absolute 0.0 0.0 - 0.1 K/uL    Comment: Performed at [facility], [address] [place], [place] [address]  Comprehensive metabolic panel     Status: Abnormal   Collection Time: 11/05/17 11:32 PM  Result Value Ref Range   Sodium 140 135 - 145 mmol/L   Potassium 4.3 3.5 - 5.1 mmol/L   Chloride 102 101 - 111 mmol/L   CO2 24 22 - 32 mmol/L   Glucose, Bld 115 (H) 65 - 99 mg/dL   BUN 9 6 - 20 mg/dL   Creatinine, Ser 0.84 0.44 - 1.00 mg/dL   Calcium 10.0 8.9 - 10.3 mg/dL   Total Protein 8.7 (H) 6.5 - 8.1 g/dL   Albumin 4.0 3.5 - 5.0 g/dL   AST 20 15 - 41 U/L   ALT 10 (L) 14 - 54 U/L   Alkaline Phosphatase 127 (H) 38 - 126 U/L   Total Bilirubin 0.6 0.3 - 1.2 mg/dL   GFR calc non Af Amer >60 >60 mL/min   GFR calc Af Amer >60 >60 mL/min    Comment: (NOTE) The eGFR has been calculated using the CKD EPI equation. This calculation has not been validated in all clinical situations. eGFR's persistently <60 mL/min signify possible Chronic Kidney Disease.    Anion gap 14 5 - 15    Comment: Performed at [facility], [address], [place], [place] [address]  Troponin I     Status: None   Collection Time: 11/05/17 11:32 PM  Result Value Ref Range   Troponin I <0.03 <0.03 ng/mL    Comment: Performed at [facility], [address], [place], [place] [address]  Hemoglobin A1c     Status: Abnormal   Collection Time: 11/05/17 11:32 PM  Result Value Ref Range   Hgb A1c  MFr Bld 6.2 (H) 4.8 - 5.6 %    Comment: (NOTE) Pre diabetes:          5.7%-6.4% Diabetes:              >6.4% Glycemic control for   <7.0% adults with diabetes    Mean Plasma Glucose 131.24 mg/dL     Comment: Performed at [facility] [address], [place], [place] [address]  Lipid panel     Status: Abnormal   Collection Time: 11/06/17  6:20 AM  Result Value Ref Range   Cholesterol 228 (H) 0 - 200 mg/dL   Triglycerides 66 <150 mg/dL   HDL 78 >40 mg/dL   Total CHOL/HDL Ratio 2.9 RATIO   VLDL 13 0 - 40 mg/dL   LDL Cholesterol 137 (H) 0 - 99 mg/dL    Comment:        Total Cholesterol/HDL:CHD Risk Coronary Heart Disease Risk Table                     Men   Women  1/2 Average Risk   3.4   3.3  Average Risk       5.0   4.4  2 X Average Risk   9.6   7.1  3 X Average Risk  23.4   11.0        Use the calculated Patient Ratio above and the CHD Risk Table to determine the patient's CHD Risk.        ATP III CLASSIFICATION (LDL):  <100     mg/dL   Optimal  100-129  mg/dL   Near or Above                    Optimal  130-159  mg/dL   Borderline  160-189  mg/dL   High  >190     mg/dL   Very High Performed at [facility], [address], [place], [place] [address]   HIV antibody     Status: None   Collection Time: 11/06/17  6:20 AM  Result Value Ref Range   HIV Screen 4th Generation wRfx Non Reactive Non Reactive    Comment: (NOTE) Performed At: [facility] [place], [place] [id number] Rush Farmer MD [id number] Performed at [facility], [address] [place], [place] [address]   Glucose, capillary     Status: None   Collection Time: 11/06/17  8:42 AM  Result Value Ref Range   Glucose-Capillary 93 65 - 99 mg/dL  Glucose, capillary     Status: None   Collection Time: 11/07/17  7:33 AM  Result Value Ref Range   Glucose-Capillary 99 65 - 99 mg/dL   Comment 1 QC Due     ABGS No results for input(s): PHART, PO2ART, TCO2, HCO3 in the last 72 hours.  Invalid input(s): PCO2 CULTURES No results found for this or any previous visit (from the past 240 hour(s)). Studies/Results: Dg Chest 2 View  Result Date: 11/06/2017 CLINICAL DATA:  Cough  and shortness of breath for 2 weeks. Chest pain for 2-3 days. History of COPD. EXAM: CHEST - 2 VIEW COMPARISON:  Chest radiograph June 21, 2017 FINDINGS: Diffuse interstitial prominence similar to prior radiograph. No pleural effusion or focal consolidation. Cardiomediastinal silhouette is normal. Calcified aortic arch. No pneumothorax. ACDF. Osteopenia. IMPRESSION: Chronic interstitial changes without focal consolidation. Electronically Signed   By: Elon Alas M.D.   On: 11/06/2017 02:10   Ct Head  Wo Contrast  Result Date: 11/06/2017 CLINICAL DATA:  Neck pain, fall and hit head EXAM: CT HEAD WITHOUT CONTRAST CT CERVICAL SPINE WITHOUT CONTRAST TECHNIQUE: Multidetector CT imaging of the head and cervical spine was performed following the standard protocol without intravenous contrast. Multiplanar CT image reconstructions of the cervical spine were also generated. COMPARISON:  CT brain 10/05/2017, CT brain and cervical spine 08/12/2017, MRI brain 03/28/2016 FINDINGS: CT HEAD FINDINGS Brain: No acute territorial infarction, hemorrhage or intracranial mass is visualized. Stable enlargement of the lateral and third ventricles with normal size fourth ventricle. Vascular: No hyperdense vessels.  Carotid vascular calcification Skull: Normal. Negative for fracture or focal lesion. Sinuses/Orbits: No acute finding. Other: None CT CERVICAL SPINE FINDINGS Alignment: Straightening of the cervical spine. No subluxation. Facet alignment within normal limits. Skull base and vertebrae: No acute fracture. No primary bone lesion or focal pathologic process. Soft tissues and spinal canal: No prevertebral fluid or swelling. No visible canal hematoma. Disc levels: Anterior plate and screw fixation C4 through C6. Mild degenerative changes at C6-C7. Upper chest: Negative. Other: None IMPRESSION: 1. No CT evidence for acute intracranial abnormality. Stable moderate lateral and third ventricular enlargement. 2. Straightening of  the cervical spine with postoperative changes C4 through C6. No definite acute osseous abnormality. Electronically Signed   By: Donavan Foil M.D.   On: 11/06/2017 00:20   Ct Cervical Spine Wo Contrast  Result Date: 11/06/2017 CLINICAL DATA:  Neck pain, fall and hit head EXAM: CT HEAD WITHOUT CONTRAST CT CERVICAL SPINE WITHOUT CONTRAST TECHNIQUE: Multidetector CT imaging of the head and cervical spine was performed following the standard protocol without intravenous contrast. Multiplanar CT image reconstructions of the cervical spine were also generated. COMPARISON:  CT brain 10/05/2017, CT brain and cervical spine 08/12/2017, MRI brain 03/28/2016 FINDINGS: CT HEAD FINDINGS Brain: No acute territorial infarction, hemorrhage or intracranial mass is visualized. Stable enlargement of the lateral and third ventricles with normal size fourth ventricle. Vascular: No hyperdense vessels.  Carotid vascular calcification Skull: Normal. Negative for fracture or focal lesion. Sinuses/Orbits: No acute finding. Other: None CT CERVICAL SPINE FINDINGS Alignment: Straightening of the cervical spine. No subluxation. Facet alignment within normal limits. Skull base and vertebrae: No acute fracture. No primary bone lesion or focal pathologic process. Soft tissues and spinal canal: No prevertebral fluid or swelling. No visible canal hematoma. Disc levels: Anterior plate and screw fixation C4 through C6. Mild degenerative changes at C6-C7. Upper chest: Negative. Other: None IMPRESSION: 1. No CT evidence for acute intracranial abnormality. Stable moderate lateral and third ventricular enlargement. 2. Straightening of the cervical spine with postoperative changes C4 through C6. No definite acute osseous abnormality. Electronically Signed   By: Donavan Foil M.D.   On: 11/06/2017 00:20   Mr Jodene Nam Neck W Wo Contrast  Result Date: 11/06/2017 CLINICAL DATA:  62 year old female with headache and neck pain, dizziness, weakness and abnormal  lower extremity sensation. EXAM: MRA NECK WITHOUT AND WITH CONTRAST TECHNIQUE: Multiplanar and multiecho pulse sequences of the neck were obtained without and with intravenous contrast. Angiographic images of the neck were obtained using MRA technique without and with intravenous contrast. CONTRAST:  66m MULTIHANCE GADOBENATE DIMEGLUMINE 529 MG/ML IV SOLN COMPARISON:  Brain MRI and intracranial MRA today reported separately. Cervical spine CT 11/05/2017 and earlier. FINDINGS: Precontrast time-of-flight imaging reveals antegrade flow in both cervical carotid and vertebral arteries. There is bilateral carotid and vertebral artery tortuosity. The left vertebral artery is mildly dominant. Both carotid bifurcations are patent. Post-contrast neck  MRA imaging reveals a 3 vessel arch configuration. No evidence of great vessel origin stenosis. The right CCA appears within normal limits. The right carotid bifurcation is normal. There is moderate tortuosity of the cervical right ICA which otherwise appears normal. Negative visible right ICA siphon and anterior circulation. The left CCA is within normal limits. The left carotid bifurcation is normal. Mild to moderate tortuosity of the cervical left ICA which otherwise appears normal. Negative visible left ICA siphon. There may be a fetal type left PCA origin. Both vertebral artery origins are partially obscured. Both vertebral arteries are otherwise patent to the vertebrobasilar junction with tortuosity but no stenosis. The left vertebral artery is somewhat dominant. Unremarkable proximal posterior circulation. IMPRESSION: Negative neck MRA aside from mild to moderate generalized carotid and vertebral artery tortuosity. Electronically Signed   By: Genevie Ann M.D.   On: 11/06/2017 08:29   Mr Brain Wo Contrast  Result Date: 11/06/2017 CLINICAL DATA:  62 year old female with headache and neck pain, dizziness, weakness and abnormal lower extremity sensation. EXAM: MRI HEAD WITHOUT  CONTRAST TECHNIQUE: Multiplanar, multiecho pulse sequences of the brain and surrounding structures were obtained without intravenous contrast. COMPARISON:  Head and cervical Spine CT without contrast 11/05/2017, and earlier. Brain MRI 03/28/2016, 06/28/2012 FINDINGS: Brain: No restricted diffusion or evidence of acute infarction. Stable gray and white matter signal throughout the brain, normal for age aside from chronic subtle cortical T2 and FLAIR hyperintensity in the left superior frontal gyrus (series 12, image 42) which is nonspecific but favored to be chronic encephalomalacia. No chronic cerebral blood products. Chronic ventriculomegaly is stable since 2013 and favored to reflect congenital or chronic aqueductal stenosis. No transependymal edema. No midline shift, mass effect, evidence of mass lesion, extra-axial collection or acute intracranial hemorrhage. Cervicomedullary junction and pituitary are within normal limits. Vascular: Major intracranial vascular flow voids are stable since 2017 and preserved. The distal left vertebral artery appears mildly dominant. Skull and upper cervical spine: Negative visible cervical spine. Visualized bone marrow signal is within normal limits. Sinuses/Orbits: Stable and negative. Other: Mastoids remain clear. Visible internal auditory structures appear stable and unremarkable. Scalp and face soft tissues appear negative. IMPRESSION: 1.  No acute intracranial abnormality. 2. Continued stable MRI appearance the brain. 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 Chronic mild nonspecific signal changes in the cortex of the left superior frontal gyrus. 3. Intracranial MRA today is reported separately. Electronically Signed   By: Genevie Ann M.D.   On: 11/06/2017 08:26   US Carotid Bilateral  Result Date: 11/06/2017 CLINICAL DATA:  62 year old female with a history of left arm weakness. Cardiovascular risk factors include hypertension, stroke/TIA, tobacco use  EXAM: BILATERAL CAROTID DUPLEX ULTRASOUND TECHNIQUE: Pearline Cables scale imaging, color Doppler and duplex ultrasound were performed of bilateral carotid and vertebral arteries in the neck. COMPARISON:  None. FINDINGS: Criteria: Quantification of carotid stenosis is based on velocity parameters that correlate the residual internal carotid diameter with NASCET-based stenosis levels, using the diameter of the distal internal carotid lumen as the denominator for stenosis measurement. The following velocity measurements were obtained: RIGHT ICA:  Systolic 89 cm/sec, Diastolic 31 cm/sec CCA:  91 cm/sec SYSTOLIC ICA/CCA RATIO:  1.0 ECA:  75 cm/sec LEFT ICA:  Systolic 85 cm/sec, Diastolic 28 cm/sec CCA:  75 cm/sec SYSTOLIC ICA/CCA RATIO:  1.1 ECA:  64 cm/sec Right Brachial SBP: Not acquired Left Brachial SBP: Not acquired RIGHT CAROTID ARTERY: No significant calcifications of the right common carotid artery. Intermediate waveform maintained. Heterogeneous and  partially calcified plaque at the right carotid bifurcation. No significant lumen shadowing. Low resistance waveform of the right ICA. No significant tortuosity. RIGHT VERTEBRAL ARTERY: Antegrade flow with low resistance waveform. LEFT CAROTID ARTERY: No significant calcifications of the left common carotid artery. Intermediate waveform maintained. Heterogeneous and partially calcified plaque at the left carotid bifurcation without significant lumen shadowing. Low resistance waveform of the left ICA. No significant tortuosity. LEFT VERTEBRAL ARTERY:  Antegrade flow with low resistance waveform. IMPRESSION: Color duplex indicates minimal heterogeneous and calcified plaque, with no hemodynamically significant stenosis by duplex criteria in the extracranial cerebrovascular circulation. Signed, Dulcy Fanny. Earleen Newport, DO [facility] Electronically Signed   By: Corrie Mckusick D.O.   On: 11/06/2017 10:22   Mr Jodene Nam Head Wo  Contrast  Result Date: 11/06/2017 CLINICAL DATA:  62 year old female with headache and neck pain, dizziness, weakness and abnormal lower extremity sensation. EXAM: MRA HEAD WITHOUT CONTRAST TECHNIQUE: Angiographic images of the Circle of Willis were obtained using MRA technique without intravenous contrast. COMPARISON:  Neck MRA and brain MRI today reported separately. FINDINGS: Antegrade flow in the posterior circulation with dominant distal left vertebral artery. No distal vertebral artery stenosis. Patent PICA origins. Patent vertebrobasilar junction. Normal AICA origins. Patent basilar artery without stenosis. Patent SCA origins. Normal right PCA origin. Fetal type left PCA origin. The right posterior communicating artery is also present. There is signal loss in the bilateral PCA P1/P2 segment which seems to correspond to moderate stenoses on the post-contrast neck MRA images today. Otherwise, allowing for mild artifact the bilateral PCA branches are within normal limits. Antegrade flow in both ICA siphons. No siphon stenosis. Ophthalmic and posterior communicating artery origins are normal. Patent carotid termini. Normal right ACA origin. There is mild to moderate stenosis at the left ACA origin, probably mild this was not evident on post-contrast neck MRA images today. The anterior communicating artery and visible ACA branches are within normal limits. Mild stenosis at the right MCA origin, also visible on the Neck MRA images today. The right MCA M1 segment, right MCA bifurcation visible right MCA branches are within normal limits. The left MCA origins normal, but there is a severe stenosis of the left MCA M1 segment at or just proximal to the bifurcation, which is also evident on the post-contrast neck MRA images today. Despite this, the left MCA branches appear patent and fairly symmetric to those on the right. IMPRESSION: 1. Intracranial MRA is positive for significant intracranial atherosclerosis and  stenosis: - Severe stenosis of the Left MCA distal M1 segment. The left MCA branches remain patent. - Moderate stenoses in the bilateral PCA P1/P2 segments. - Mild stenoses at the Left ACA and Right MCA origins. 2. Brain MRI today is reported separately. Electronically Signed   By: Genevie Ann M.D.   On: 11/06/2017 08:37    Medications:  Prior to Admission:  Medications Prior to Admission  Medication Sig Dispense Refill Last Dose  . acetaminophen-codeine (TYLENOL #3) 300-30 MG tablet Take 1 tablet by mouth 4 (four) times daily as needed for moderate pain.   Past Week at Unknown time  . amLODipine (NORVASC) 5 MG tablet Take 1 tablet (5 mg total) by mouth daily. 14 tablet 0 Past Week at Unknown time  . Aspirin-Salicylamide-Caffeine (BC HEADACHE POWDER PO) Take 1 packet by mouth daily as needed. For pain   11/04/2017 at Unknown time  . busPIRone (BUSPAR) 10 MG tablet Take 10 mg by mouth 2 (two) times daily.  11/05/2017 at Unknown time  . diazepam (VALIUM) 10 MG tablet TAKE ONE TABLET BY MOUTH TWICE DAILY  5 11/05/2017 at Unknown time  . Dihydrotachysterol (DHT PO) Take 1 capsule by mouth daily.   11/05/2017 at Unknown time  . omeprazole (PRILOSEC) 20 MG capsule Take 20 mg by mouth daily.  6 11/05/2017 at Unknown time  . PROAIR HFA 108 (90 Base) MCG/ACT inhaler INHALE TWO PUFFS BY MOUTH 4 TIMES DAILY AS NEEDED FOR SHORTNESS OF BREATH  12 unknown  . traZODone (DESYREL) 50 MG tablet TAKE ONE TABLET BY MOUTH AT BEDTIME  5 unknown  . cephALEXin (KEFLEX) 500 MG capsule Take 1 capsule (500 mg total) by mouth 4 (four) times daily. (Patient not taking: Reported on 11/05/2017) 20 capsule 0 Not Taking at Unknown time   Scheduled: . aspirin  300 mg Rectal Daily   Or  . aspirin  325 mg Oral Daily  . enoxaparin (LOVENOX) injection  40 mg Subcutaneous Q24H  . nicotine  14 mg Transdermal Daily   Continuous:  [id number] **OR** acetaminophen, guaiFENesin-dextromethorphan, ipratropium-albuterol, ketorolac, ondansetron  **OR** ondansetron (ZOFRAN) IV  Assesment: She was admitted with weakness of her left arm which is better.  She has chronic ataxic gait.  She has chronic dysarthria.  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  She feels better and wants to go home. Principal Problem:   Hemiparesis ([place]) Active Problems:   Altered mental status   Cerebral ventriculomegaly   Depression   Neck pain   COPD (chronic obstructive pulmonary disease) ([facility])   Tobacco abuse   CVA (cerebral vascular accident) ([place])    Plan: Discharge home with home health services    LOS: 0 days   Evelyn Moch L 11/07/2017, 8:49 AM

## 2017-11-07 NOTE — Discharge Summary (Signed)
Physician Discharge Summary  Patient ID: Lindsey Howard MRN: [id number] DOB/AGE: 09/22/1955 62 y.o. Primary Care Physician:Maclovia Uher, Percell Miller, MD Admit date: 11/05/2017 Discharge date: 11/07/2017    Discharge Diagnoses:   Principal Problem:   Hemiparesis [facility]) Active Problems:   Altered mental status   Cerebral ventriculomegaly   Depression   Neck pain   COPD (chronic obstructive pulmonary disease) (HCC)   Tobacco abuse   CVA (cerebral vascular accident) ([place])   Allergies as of 11/07/2017      Reactions   Latex Other (See Comments)   Burns,irritates skin   Penicillins Swelling   Has patient had a PCN reaction causing immediate rash, facial/tongue/throat swelling, SOB or lightheadedness with hypotension: No Has patient had a PCN reaction causing severe rash involving mucus membranes or skin necrosis: No Has patient had a PCN reaction that required hospitalization No Has patient had a PCN reaction occurring within the last 10 years: No If all of the above answers are "NO", then may proceed with Cephalosporin use.      Medication List    STOP taking these medications   BC HEADACHE POWDER PO   busPIRone 10 MG tablet Commonly known as:  BUSPAR   cephALEXin 500 MG capsule Commonly known as:  KEFLEX     TAKE these medications   acetaminophen-codeine 300-30 MG tablet Commonly known as:  TYLENOL #3 Take 1 tablet by mouth 4 (four) times daily as needed for moderate pain.   amLODipine 5 MG tablet Commonly known as:  NORVASC Take 1 tablet (5 mg total) by mouth daily.   DHT PO Take 1 capsule by mouth daily.   diazepam 10 MG tablet Commonly known as:  VALIUM Take 0.5 tablets (5 mg total) by mouth 2 (two) times daily. Take 1/2 tablet twice a day as needed for anxiety What changed:    how much to take  additional instructions   omeprazole 20 MG capsule Commonly known as:  PRILOSEC Take 20 mg by mouth daily.   PROAIR HFA 108 (90 Base) MCG/ACT inhaler Generic drug:   albuterol INHALE TWO PUFFS BY MOUTH 4 TIMES DAILY AS NEEDED FOR SHORTNESS OF BREATH   traZODone 50 MG tablet Commonly known as:  DESYREL TAKE ONE TABLET BY MOUTH AT BEDTIME       Discharged Condition: Improved    Consults: None  Significant Diagnostic Studies: Dg Chest 2 View  Result Date: 11/06/2017 CLINICAL DATA:  Cough and shortness of breath for 2 weeks. Chest pain for 2-3 days. History of COPD. EXAM: CHEST - 2 VIEW COMPARISON:  Chest radiograph June 21, 2017 FINDINGS: Diffuse interstitial prominence similar to prior radiograph. No pleural effusion or focal consolidation. Cardiomediastinal silhouette is normal. Calcified aortic arch. No pneumothorax. ACDF. Osteopenia. IMPRESSION: Chronic interstitial changes without focal consolidation. Electronically Signed   By: Elon Alas M.D.   On: 11/06/2017 02:10   Ct Head Wo Contrast  Result Date: 11/06/2017 CLINICAL DATA:  Neck pain, fall and hit head EXAM: CT HEAD WITHOUT CONTRAST CT CERVICAL SPINE WITHOUT CONTRAST TECHNIQUE: Multidetector CT imaging of the head and cervical spine was performed following the standard protocol without intravenous contrast. Multiplanar CT image reconstructions of the cervical spine were also generated. COMPARISON:  CT brain 10/05/2017, CT brain and cervical spine 08/12/2017, MRI brain 03/28/2016 FINDINGS: CT HEAD FINDINGS Brain: No acute territorial infarction, hemorrhage or intracranial mass is visualized. Stable enlargement of the lateral and third ventricles with normal size fourth ventricle. Vascular: No hyperdense vessels.  Carotid vascular calcification Skull:  Normal. Negative for fracture or focal lesion. Sinuses/Orbits: No acute finding. Other: None CT CERVICAL SPINE FINDINGS Alignment: Straightening of the cervical spine. No subluxation. Facet alignment within normal limits. Skull base and vertebrae: No acute fracture. No primary bone lesion or focal pathologic process. Soft tissues and spinal  canal: No prevertebral fluid or swelling. No visible canal hematoma. Disc levels: Anterior plate and screw fixation C4 through C6. Mild degenerative changes at C6-C7. Upper chest: Negative. Other: None IMPRESSION: 1. No CT evidence for acute intracranial abnormality. Stable moderate lateral and third ventricular enlargement. 2. Straightening of the cervical spine with postoperative changes C4 through C6. No definite acute osseous abnormality. Electronically Signed   By: Donavan Foil M.D.   On: 11/06/2017 00:20   Ct Cervical Spine Wo Contrast  Result Date: 11/06/2017 CLINICAL DATA:  Neck pain, fall and hit head EXAM: CT HEAD WITHOUT CONTRAST CT CERVICAL SPINE WITHOUT CONTRAST TECHNIQUE: Multidetector CT imaging of the head and cervical spine was performed following the standard protocol without intravenous contrast. Multiplanar CT image reconstructions of the cervical spine were also generated. COMPARISON:  CT brain 10/05/2017, CT brain and cervical spine 08/12/2017, MRI brain 03/28/2016 FINDINGS: CT HEAD FINDINGS Brain: No acute territorial infarction, hemorrhage or intracranial mass is visualized. Stable enlargement of the lateral and third ventricles with normal size fourth ventricle. Vascular: No hyperdense vessels.  Carotid vascular calcification Skull: Normal. Negative for fracture or focal lesion. Sinuses/Orbits: No acute finding. Other: None CT CERVICAL SPINE FINDINGS Alignment: Straightening of the cervical spine. No subluxation. Facet alignment within normal limits. Skull base and vertebrae: No acute fracture. No primary bone lesion or focal pathologic process. Soft tissues and spinal canal: No prevertebral fluid or swelling. No visible canal hematoma. Disc levels: Anterior plate and screw fixation C4 through C6. Mild degenerative changes at C6-C7. Upper chest: Negative. Other: None IMPRESSION: 1. No CT evidence for acute intracranial abnormality. Stable moderate lateral and third ventricular  enlargement. 2. Straightening of the cervical spine with postoperative changes C4 through C6. No definite acute osseous abnormality. Electronically Signed   By: Donavan Foil M.D.   On: 11/06/2017 00:20   Mr Jodene Nam Neck W Wo Contrast  Result Date: 11/06/2017 CLINICAL DATA:  62 year old female with headache and neck pain, dizziness, weakness and abnormal lower extremity sensation. EXAM: MRA NECK WITHOUT AND WITH CONTRAST TECHNIQUE: Multiplanar and multiecho pulse sequences of the neck were obtained without and with intravenous contrast. Angiographic images of the neck were obtained using MRA technique without and with intravenous contrast. CONTRAST:  43mL MULTIHANCE GADOBENATE DIMEGLUMINE 529 MG/ML IV SOLN COMPARISON:  Brain MRI and intracranial MRA today reported separately. Cervical spine CT 11/05/2017 and earlier. FINDINGS: Precontrast time-of-flight imaging reveals antegrade flow in both cervical carotid and vertebral arteries. There is bilateral carotid and vertebral artery tortuosity. The left vertebral artery is mildly dominant. Both carotid bifurcations are patent. Post-contrast neck MRA imaging reveals a 3 vessel arch configuration. No evidence of great vessel origin stenosis. The right CCA appears within normal limits. The right carotid bifurcation is normal. There is moderate tortuosity of the cervical right ICA which otherwise appears normal. Negative visible right ICA siphon and anterior circulation. The left CCA is within normal limits. The left carotid bifurcation is normal. Mild to moderate tortuosity of the cervical left ICA which otherwise appears normal. Negative visible left ICA siphon. There may be a fetal type left PCA origin. Both vertebral artery origins are partially obscured. Both vertebral arteries are otherwise patent to the vertebrobasilar junction  with tortuosity but no stenosis. The left vertebral artery is somewhat dominant. Unremarkable proximal posterior circulation. IMPRESSION:  Negative neck MRA aside from mild to moderate generalized carotid and vertebral artery tortuosity. Electronically Signed   By: Genevie Ann M.D.   On: 11/06/2017 08:29   Mr Brain Wo Contrast  Result Date: 11/06/2017 CLINICAL DATA:  62 year old female with headache and neck pain, dizziness, weakness and abnormal lower extremity sensation. EXAM: MRI HEAD WITHOUT CONTRAST TECHNIQUE: Multiplanar, multiecho pulse sequences of the brain and surrounding structures were obtained without intravenous contrast. COMPARISON:  Head and cervical Spine CT without contrast 11/05/2017, and earlier. Brain MRI 03/28/2016, 06/28/2012 FINDINGS: Brain: No restricted diffusion or evidence of acute infarction. Stable gray and white matter signal throughout the brain, normal for age aside from chronic subtle cortical T2 and FLAIR hyperintensity in the left superior frontal gyrus (series 12, image 42) which is nonspecific but favored to be chronic encephalomalacia. No chronic cerebral blood products. Chronic ventriculomegaly is stable since 2013 and favored to reflect congenital or chronic aqueductal stenosis. No transependymal edema. No midline shift, mass effect, evidence of mass lesion, extra-axial collection or acute intracranial hemorrhage. Cervicomedullary junction and pituitary are within normal limits. Vascular: Major intracranial vascular flow voids are stable since 2017 and preserved. The distal left vertebral artery appears mildly dominant. Skull and upper cervical spine: Negative visible cervical spine. Visualized bone marrow signal is within normal limits. Sinuses/Orbits: Stable and negative. Other: Mastoids remain clear. Visible internal auditory structures appear stable and unremarkable. Scalp and face soft tissues appear negative. IMPRESSION: 1.  No acute intracranial abnormality. 2. Continued stable MRI appearance the brain. 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 Chronic mild nonspecific signal changes in  the cortex of the left superior frontal gyrus. 3. Intracranial MRA today is reported separately. Electronically Signed   By: Genevie Ann M.D.   On: 11/06/2017 08:26   US Carotid Bilateral  Result Date: 11/06/2017 CLINICAL DATA:  62 year old female with a history of left arm weakness. Cardiovascular risk factors include hypertension, stroke/TIA, tobacco use EXAM: BILATERAL CAROTID DUPLEX ULTRASOUND TECHNIQUE: Pearline Cables scale imaging, color Doppler and duplex ultrasound were performed of bilateral carotid and vertebral arteries in the neck. COMPARISON:  None. FINDINGS: Criteria: Quantification of carotid stenosis is based on velocity parameters that correlate the residual internal carotid diameter with NASCET-based stenosis levels, using the diameter of the distal internal carotid lumen as the denominator for stenosis measurement. The following velocity measurements were obtained: RIGHT ICA:  Systolic 89 cm/sec, Diastolic 31 cm/sec CCA:  91 cm/sec SYSTOLIC ICA/CCA RATIO:  1.0 ECA:  75 cm/sec LEFT ICA:  Systolic 85 cm/sec, Diastolic 28 cm/sec CCA:  75 cm/sec SYSTOLIC ICA/CCA RATIO:  1.1 ECA:  64 cm/sec Right Brachial SBP: Not acquired Left Brachial SBP: Not acquired RIGHT CAROTID ARTERY: No significant calcifications of the right common carotid artery. Intermediate waveform maintained. Heterogeneous and partially calcified plaque at the right carotid bifurcation. No significant lumen shadowing. Low resistance waveform of the right ICA. No significant tortuosity. RIGHT VERTEBRAL ARTERY: Antegrade flow with low resistance waveform. LEFT CAROTID ARTERY: No significant calcifications of the left common carotid artery. Intermediate waveform maintained. Heterogeneous and partially calcified plaque at the left carotid bifurcation without significant lumen shadowing. Low resistance waveform of the left ICA. No significant tortuosity. LEFT VERTEBRAL ARTERY:  Antegrade flow with low resistance waveform. IMPRESSION: Color duplex  indicates minimal heterogeneous and calcified plaque, with no hemodynamically significant stenosis by duplex criteria in the extracranial cerebrovascular circulation. Signed, Dulcy Fanny.  Earleen Newport, DO [facility] Radiology Electronically Signed   By: Corrie Mckusick D.O.   On: 11/06/2017 10:22   Mr Jodene Nam Head Wo Contrast  Result Date: 11/06/2017 CLINICAL DATA:  62 year old female with headache and neck pain, dizziness, weakness and abnormal lower extremity sensation. EXAM: MRA HEAD WITHOUT CONTRAST TECHNIQUE: Angiographic images of the Circle of Willis were obtained using MRA technique without intravenous contrast. COMPARISON:  Neck MRA and brain MRI today reported separately. FINDINGS: Antegrade flow in the posterior circulation with dominant distal left vertebral artery. No distal vertebral artery stenosis. Patent PICA origins. Patent vertebrobasilar junction. Normal AICA origins. Patent basilar artery without stenosis. Patent SCA origins. Normal right PCA origin. Fetal type left PCA origin. The right posterior communicating artery is also present. There is signal loss in the bilateral PCA P1/P2 segment which seems to correspond to moderate stenoses on the post-contrast neck MRA images today. Otherwise, allowing for mild artifact the bilateral PCA branches are within normal limits. Antegrade flow in both ICA siphons. No siphon stenosis. Ophthalmic and posterior communicating artery origins are normal. Patent carotid termini. Normal right ACA origin. There is mild to moderate stenosis at the left ACA origin, probably mild this was not evident on post-contrast neck MRA images today. The anterior communicating artery and visible ACA branches are within normal limits. Mild stenosis at the right MCA origin, also visible on the Neck MRA images today. The right MCA M1 segment, right MCA bifurcation visible right MCA branches are within normal limits. The left MCA origins normal,  but there is a severe stenosis of the left MCA M1 segment at or just proximal to the bifurcation, which is also evident on the post-contrast neck MRA images today. Despite this, the left MCA branches appear patent and fairly symmetric to those on the right. IMPRESSION: 1. Intracranial MRA is positive for significant intracranial atherosclerosis and stenosis: - Severe stenosis of the Left MCA distal M1 segment. The left MCA branches remain patent. - Moderate stenoses in the bilateral PCA P1/P2 segments. - Mild stenoses at the Left ACA and Right MCA origins. 2. Brain MRI today is reported separately. Electronically Signed   By: Genevie Ann M.D.   On: 11/06/2017 08:37    Lab Results: Basic Metabolic Panel: Recent Labs    11/05/17 2332  NA 140  K 4.3  CL 102  CO2 24  GLUCOSE 115*  BUN 9  CREATININE 0.84  CALCIUM 10.0   Liver Function Tests: Recent Labs    11/05/17 2332  AST 20  ALT 10*  ALKPHOS 127*  BILITOT 0.6  PROT 8.7*  ALBUMIN 4.0     CBC: Recent Labs    11/05/17 2332  WBC 17.8*  NEUTROABS 11.4*  HGB 16.1*  HCT 47.8*  MCV 96.2  PLT 333    No results found for this or any previous visit (from the past 240 hour(s)).   Hospital Course: This is a 62 year old who fell about a month prior to admission and he came in complaining of pain in her neck and complaining of left arm weakness.  At baseline she has trouble with dysarthria and ataxia from ventriculomegaly.  She improved.  She had multiple MRIs and other studies none of which revealed anything acute.  By the time of discharge she said she felt better.  It is felt that she is going to need PT OT at home and that will be arranged.  Discharge Exam: Blood pressure 128/84, pulse 81, temperature 98.6 F (  37 C), temperature source Oral, resp. rate 16, SpO2 95 %. She is awake and alert.  She has dysarthria.  She has ataxic gait.  Disposition: Home with home health services.  I cut her Valium dose in half to see if that will  make a difference.  Discharge Instructions    Face-to-face encounter (required for Medicare/Medicaid patients)   Complete by:  As directed    I Loyola Santino L certify that this patient is under my care and that I, or a nurse practitioner or physician's assistant working with me, had a face-to-face encounter that meets the physician face-to-face encounter requirements with this patient on 11/07/2017. The encounter with the patient was in whole, or in part for the following medical condition(s) which is the primary reason for home health care (List medical condition): Left arm weakness, dysarthria, ataxic gait   The encounter with the patient was in whole, or in part, for the following medical condition, which is the primary reason for home health care:  Left arm weakness, dysarthria, ataxic gait   I certify that, based on my findings, the following services are medically necessary home health services:   Nursing Physical therapy     Reason for Medically Necessary Home Health Services:  Skilled Nursing- Change/Decline in Patient Status   My clinical findings support the need for the above services:  Unsafe ambulation due to balance issues   Further, I certify that my clinical findings support that this patient is homebound due to:  Unsafe ambulation due to balance issues   Home Health   Complete by:  As directed    To provide the following care/treatments:   PT OT RN          Signed: Medha Pippen L   11/07/2017, 8:56 AM

## 2018-07-23 ENCOUNTER — Encounter: Payer: Self-pay | Admitting: Internal Medicine

## 2018-08-14 ENCOUNTER — Other Ambulatory Visit: Payer: Self-pay

## 2018-08-14 ENCOUNTER — Encounter (HOSPITAL_COMMUNITY): Payer: Self-pay | Admitting: Emergency Medicine

## 2018-08-14 DIAGNOSIS — F17210 Nicotine dependence, cigarettes, uncomplicated: Secondary | ICD-10-CM | POA: Insufficient documentation

## 2018-08-14 DIAGNOSIS — I10 Essential (primary) hypertension: Secondary | ICD-10-CM | POA: Diagnosis not present

## 2018-08-14 DIAGNOSIS — Z91040 Latex allergy status: Secondary | ICD-10-CM | POA: Diagnosis not present

## 2018-08-14 DIAGNOSIS — M79604 Pain in right leg: Secondary | ICD-10-CM | POA: Diagnosis not present

## 2018-08-14 DIAGNOSIS — M79605 Pain in left leg: Secondary | ICD-10-CM | POA: Insufficient documentation

## 2018-08-14 DIAGNOSIS — J449 Chronic obstructive pulmonary disease, unspecified: Secondary | ICD-10-CM | POA: Insufficient documentation

## 2018-08-14 DIAGNOSIS — M5430 Sciatica, unspecified side: Secondary | ICD-10-CM | POA: Diagnosis not present

## 2018-08-14 DIAGNOSIS — Z79899 Other long term (current) drug therapy: Secondary | ICD-10-CM | POA: Diagnosis not present

## 2018-08-14 DIAGNOSIS — M5116 Intervertebral disc disorders with radiculopathy, lumbar region: Secondary | ICD-10-CM | POA: Diagnosis not present

## 2018-08-14 DIAGNOSIS — R5381 Other malaise: Secondary | ICD-10-CM | POA: Diagnosis not present

## 2018-08-14 NOTE — ED Triage Notes (Signed)
Pt arrived by [facility] c/o bilateral leg pain starting 4 days ago. Pt states "I feel like needles are sticking my legs". No swelling noted to legs. Pt denies any injury.

## 2018-08-15 ENCOUNTER — Emergency Department (HOSPITAL_COMMUNITY): Payer: Medicaid Other

## 2018-08-15 ENCOUNTER — Emergency Department (HOSPITAL_COMMUNITY)
Admission: EM | Admit: 2018-08-15 | Discharge: 2018-08-15 | Disposition: A | Discharge status: Home / Self Care | Payer: Medicaid Other | Attending: Emergency Medicine | Admitting: Emergency Medicine

## 2018-08-15 DIAGNOSIS — M79605 Pain in left leg: Secondary | ICD-10-CM | POA: Diagnosis not present

## 2018-08-15 DIAGNOSIS — M79604 Pain in right leg: Secondary | ICD-10-CM | POA: Diagnosis not present

## 2018-08-15 DIAGNOSIS — M5116 Intervertebral disc disorders with radiculopathy, lumbar region: Secondary | ICD-10-CM | POA: Diagnosis not present

## 2018-08-15 DIAGNOSIS — M5430 Sciatica, unspecified side: Secondary | ICD-10-CM

## 2018-08-15 LAB — CBC WITH DIFFERENTIAL/PLATELET
Abs Immature Granulocytes: 0.04 10*3/uL (ref 0.00–0.07)
Basophils Absolute: 0.1 10*3/uL (ref 0.0–0.1)
Basophils Relative: 0 %
EOS ABS: 0.1 10*3/uL (ref 0.0–0.5)
Eosinophils Relative: 1 %
HCT: 54.1 % — ABNORMAL HIGH (ref 36.0–46.0)
Hemoglobin: 17.3 g/dL — ABNORMAL HIGH (ref 12.0–15.0)
Immature Granulocytes: 0 %
Lymphocytes Relative: 26 %
Lymphs Abs: 3.3 10*3/uL (ref 0.7–4.0)
MCH: 32.3 pg (ref 26.0–34.0)
MCHC: 32 g/dL (ref 30.0–36.0)
MCV: 101.1 fL — ABNORMAL HIGH (ref 80.0–100.0)
MONO ABS: 1.3 10*3/uL — AB (ref 0.1–1.0)
Monocytes Relative: 10 %
Neutro Abs: 7.7 10*3/uL (ref 1.7–7.7)
Neutrophils Relative %: 63 %
Platelets: 368 10*3/uL (ref 150–400)
RBC: 5.35 MIL/uL — ABNORMAL HIGH (ref 3.87–5.11)
RDW: 15.2 % (ref 11.5–15.5)
WBC: 12.4 10*3/uL — ABNORMAL HIGH (ref 4.0–10.5)
nRBC: 0 % (ref 0.0–0.2)

## 2018-08-15 LAB — BASIC METABOLIC PANEL
Anion gap: 10 (ref 5–15)
BUN: 13 mg/dL (ref 8–23)
CHLORIDE: 105 mmol/L (ref 98–111)
CO2: 23 mmol/L (ref 22–32)
CREATININE: 0.66 mg/dL (ref 0.44–1.00)
Calcium: 9.4 mg/dL (ref 8.9–10.3)
GFR calc Af Amer: >60 mL/min (ref >60)
GFR calc non Af Amer: >60 mL/min (ref >60)
GLUCOSE: 117 mg/dL — AB (ref 70–99)
Potassium: 4.7 mmol/L (ref 3.5–5.1)
Sodium: 138 mmol/L (ref 135–145)

## 2018-08-15 LAB — URINALYSIS, ROUTINE W REFLEX MICROSCOPIC
BILIRUBIN URINE: NEGATIVE
Glucose, UA: NEGATIVE mg/dL
KETONES UR: NEGATIVE mg/dL
Leukocytes, UA: NEGATIVE
Nitrite: NEGATIVE
Protein, ur: 100 mg/dL — AB
SPECIFIC GRAVITY, URINE: 1.012 (ref 1.005–1.030)
pH: 6 (ref 5.0–8.0)

## 2018-08-15 MED ORDER — METHYLPREDNISOLONE SODIUM SUCC 125 MG IJ SOLR
125.0000 mg | Freq: Once | INTRAMUSCULAR | Status: AC
  Administered 2018-08-15: 125 mg via INTRAVENOUS
  Filled 2018-08-15: qty 2

## 2018-08-15 MED ORDER — PREDNISONE 10 MG PO TABS: 20.0000 mg | ORAL_TABLET | Freq: Every day | ORAL | 0 refills | Status: DC

## 2018-08-15 MED ORDER — OXYCODONE-ACETAMINOPHEN 5-325 MG PO TABS: 1.0000 | ORAL_TABLET | Freq: Four times a day (QID) | ORAL | 0 refills | Status: DC | PRN

## 2018-08-15 MED ORDER — CYCLOBENZAPRINE HCL 10 MG PO TABS: 10.0000 mg | ORAL_TABLET | Freq: Three times a day (TID) | ORAL | 0 refills | Status: DC

## 2018-08-15 MED ORDER — LORAZEPAM 2 MG/ML IJ SOLN
1.0000 mg | Freq: Once | INTRAMUSCULAR | Status: AC
  Administered 2018-08-15: 1 mg via INTRAVENOUS
  Filled 2018-08-15: qty 1

## 2018-08-15 MED ORDER — HYDROMORPHONE HCL 1 MG/ML IJ SOLN
1.0000 mg | Freq: Once | INTRAMUSCULAR | Status: AC
  Administered 2018-08-15: 1 mg via INTRAVENOUS
  Filled 2018-08-15: qty 1

## 2018-08-15 NOTE — Discharge Instructions (Signed)
Follow-up with Dr. Arnoldo Morale in the next couple weeks or follow-up with your primary care doctor

## 2018-08-15 NOTE — ED Provider Notes (Signed)
I spoke with patient's neurosurgeon Dr. Arnoldo Morale and he reviewed the MRI of the lumbar spine.  He stated to treated with pain medicines and steroids and he will follow-up in a couple weeks   Milton Ferguson, MD 08/15/18 1336

## 2018-08-15 NOTE — ED Notes (Signed)
Iv infiltrated when IV flushed after meds given

## 2018-08-15 NOTE — ED Provider Notes (Signed)
[facility] EMERGENCY DEPARTMENT Provider Note   CSN: [id number] Arrival date & time: 08/14/18  2244     History   Chief Complaint Chief Complaint  Patient presents with  . Leg Pain    HPI Lindsey Howard is a 63 y.o. female.  Patient is a 63 year old female with history of COPD, chronic neck and back pain, hypertension, and prior back surgery.  She presents today for evaluation of bilateral leg pain.  She describes a 4-day history of worsening "pins-and-needles" sensation to both lower legs.  This begins in the mid thigh and extends through her feet.  This began in the absence of any injury or trauma.  She reports that she has been unable to ambulate secondary to these symptoms.  She denies any bowel or bladder incontinence.  She denies any fevers or chills.  The history is provided by the patient.  Leg Pain  Location:  Leg Leg location:  L leg and R leg Pain details:    Severity:  Moderate   Onset quality:  Gradual   Progression:  Worsening Relieved by:  Nothing Worsened by:  Nothing   Past Medical History:  Diagnosis Date  . 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 Chronic back pain   . Chronic neck pain   . COPD (chronic obstructive pulmonary disease) ([place])   . Depression   . Esophagitis   . GERD (gastroesophageal reflux disease)   . High cholesterol   . Hypertension   . Panic attacks   . Sciatica   . Speech abnormality    chronic  . Stroke [facility])     Patient Active Problem List   Diagnosis Date Noted  . Hemiparesis ([place]) 11/06/2017  . Neck pain 11/06/2017  . COPD (chronic obstructive pulmonary disease) ([place]) 11/06/2017  . Tobacco abuse 11/06/2017  . CVA (cerebral vascular accident) ([place]) 11/06/2017  . Malnutrition of moderate degree 03/29/2016  . Slurred speech 03/27/2016  . Altered mental status 06/29/2012  . Cerebral ventriculomegaly 06/29/2012  . Depression 06/29/2012    Past Surgical  History:  Procedure Laterality Date  . BACK SURGERY    . NECK SURGERY    . NEPHRECTOMY Left 1978   s/p trauma  . OOPHORECTOMY    . SPLENECTOMY, TOTAL  1978   s/p trauma     OB History    Gravida  4   Para  4   Term  4   Preterm      AB      Living  4     SAB      TAB      Ectopic      Multiple      Live Births               Home Medications    Prior to Admission medications   Medication Sig Start Date End Date Taking? Authorizing Provider  acetaminophen-codeine (TYLENOL #3) 300-30 MG tablet Take 1 tablet by mouth 4 (four) times daily as needed for moderate pain.    [provider]  amLODipine (NORVASC) 5 MG tablet Take 1 tablet (5 mg total) by mouth daily. 03/05/17   Davonna Belling, MD  diazepam (VALIUM) 10 MG tablet Take 0.5 tablets (5 mg total) by mouth 2 (two) times daily. Take 1/2 tablet twice a day as needed for anxiety 11/07/17   Sinda Du, MD  Dihydrotachysterol (DHT PO) Take 1 capsule by mouth daily.  [provider]  omeprazole (PRILOSEC) 20 MG capsule Take 20 mg by mouth daily. 10/27/17   [provider]  PROAIR HFA 108 (90 Base) MCG/ACT inhaler INHALE TWO PUFFS BY MOUTH 4 TIMES DAILY AS NEEDED FOR SHORTNESS OF BREATH 06/23/17   [provider]  traZODone (DESYREL) 50 MG tablet TAKE ONE TABLET BY MOUTH AT BEDTIME 08/03/17   [provider]    Family History Family History  Problem Relation Age of Onset  . Heart failure Mother   . Stroke Father   . Seizures Father     Social History Social History   Tobacco Use  . Smoking status: Current Every Day Smoker    Packs/day: 3.00    Years: 40.00    Pack years: 120.00    Types: Cigarettes  . Smokeless tobacco: Never Used  Substance Use Topics  . Alcohol use: Yes    Alcohol/week: 8.0 standard drinks    Types: 8 Cans of beer per week  . Drug use: No     Allergies   Latex and Penicillins   Review of Systems Review of Systems  All  other systems reviewed and are negative.    Physical Exam Updated Vital Signs Ht 5\' 2"  (1.575 m)   Wt 72.6 kg   BMI 29.26 kg/m   Physical Exam Vitals signs and nursing note reviewed.  Constitutional:      General: She is not in acute distress.    Appearance: She is well-developed. She is not diaphoretic.  HENT:     Head: Normocephalic and atraumatic.  Neck:     Musculoskeletal: Normal range of motion and neck supple.  Cardiovascular:     Rate and Rhythm: Normal rate and regular rhythm.     Heart sounds: No murmur. No friction rub. No gallop.   Pulmonary:     Effort: Pulmonary effort is normal. No respiratory distress.     Breath sounds: Normal breath sounds. No wheezing.  Abdominal:     General: Bowel sounds are normal. There is no distension.     Palpations: Abdomen is soft.     Tenderness: There is no abdominal tenderness.  Musculoskeletal: Normal range of motion.  Skin:    General: Skin is warm and dry.  Neurological:     Mental Status: She is alert and oriented to person, place, and time.     Comments: Sensation is intact to both lower extremities.  I am unable to elicit patellar or Achilles DTRs.  Strength is intact in both lower extremities.      ED Treatments / Results  Labs (all labs ordered are listed, but only abnormal results are displayed) Labs Reviewed  BASIC METABOLIC PANEL  CBC WITH DIFFERENTIAL/PLATELET  URINALYSIS, ROUTINE W REFLEX MICROSCOPIC    EKG None  Radiology No results found.  Procedures Procedures (including critical care time)  Medications Ordered in ED Medications - No data to display   Initial Impression / Assessment and Plan / ED Course  I have reviewed the triage vital signs and the nursing notes.  Pertinent labs & imaging results that were available during my care of the patient were reviewed by me and considered in my medical decision making (see chart for details).  Patient presents here with complaints of low back  pain and numbness in both legs.  She states that this is progressed over the past 4 days.  She reports being unable to ambulate at home secondary to these symptoms.  She denies to me she  is experiencing any bowel or bladder incontinence.  She does have history of prior back surgery by Dr. Arnoldo Morale.  This was approximately 10 years ago.  Patient will be kept in the ER for the next several hours.  When MRI arrives in the morning, the patient will have MRI scans of her thoracic and lumbar spines to rule out cord compression.  Laboratory studies pending.  Care will be signed out to Dr. Roderic Palau at shift change.  He will obtain the results of these imaging studies and determine the final disposition.  Final Clinical Impressions(s) / ED Diagnoses   Final diagnoses:  None    ED Discharge Orders    None       Veryl Speak, MD 08/15/18 [phone]

## 2018-09-04 DIAGNOSIS — F319 Bipolar disorder, unspecified: Secondary | ICD-10-CM | POA: Diagnosis not present

## 2018-09-04 DIAGNOSIS — M5000 Cervical disc disorder with myelopathy, unspecified cervical region: Secondary | ICD-10-CM | POA: Diagnosis not present

## 2018-09-04 DIAGNOSIS — R270 Ataxia, unspecified: Secondary | ICD-10-CM | POA: Diagnosis not present

## 2018-09-04 DIAGNOSIS — J449 Chronic obstructive pulmonary disease, unspecified: Secondary | ICD-10-CM | POA: Diagnosis not present

## 2018-09-04 DIAGNOSIS — M545 Low back pain: Secondary | ICD-10-CM | POA: Diagnosis not present

## 2018-12-04 DIAGNOSIS — I10 Essential (primary) hypertension: Secondary | ICD-10-CM | POA: Diagnosis not present

## 2018-12-04 DIAGNOSIS — K210 Gastro-esophageal reflux disease with esophagitis: Secondary | ICD-10-CM | POA: Diagnosis not present

## 2018-12-04 DIAGNOSIS — J449 Chronic obstructive pulmonary disease, unspecified: Secondary | ICD-10-CM | POA: Diagnosis not present

## 2018-12-04 DIAGNOSIS — M545 Low back pain: Secondary | ICD-10-CM | POA: Diagnosis not present

## 2019-04-15 IMAGING — MR MR MRA HEAD W/O CM
10 of 11 series · 34 of 48 positions shown · non-contrast
Comparison: Neck MRA and brain MRI today reported separately.

CLINICAL DATA: 62-year-old female with headache and neck pain,
dizziness, weakness and abnormal lower extremity sensation.

EXAM:
MRA HEAD WITHOUT CONTRAST
TECHNIQUE: Angiographic images of the Circle of Willis were obtained using MRA
technique without intravenous contrast.

[Series 2: [id number]_sag · sagittal · 5.0mm · 0.40mm/px · 2 of 20 slices shown]
[im 1/20]
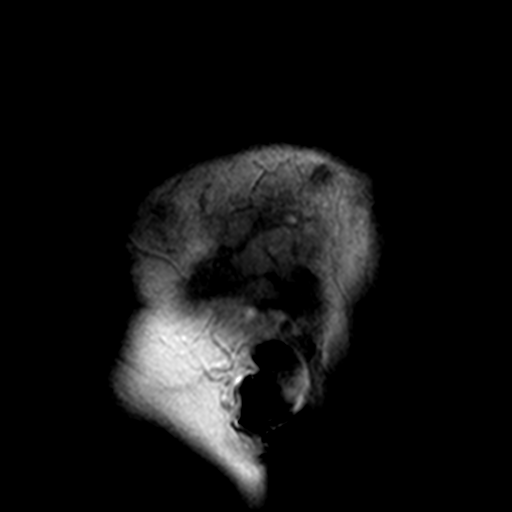
[im 20/20]
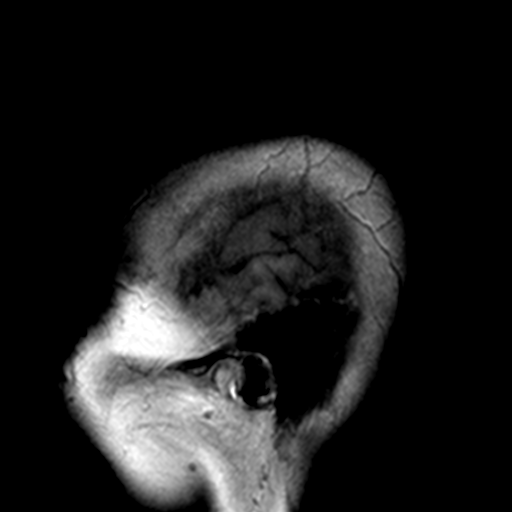

[Series 3: DWI · axial · 3.0mm · 0.68mm/px · z∈[-109,+52]mm · 5 of 55 slices shown (1 of 4)]
[im 1/55]
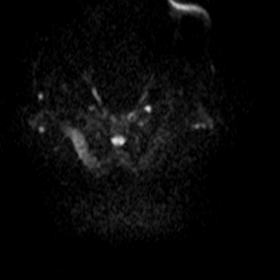
[im 14/55]
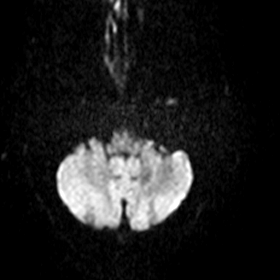
[im 28/55]
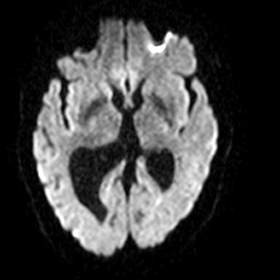
[im 41/55]
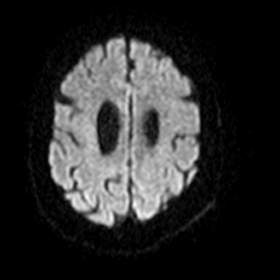
[im 55/55]
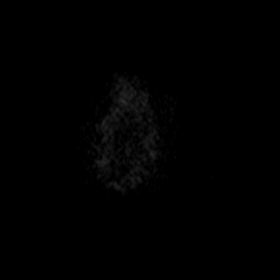

[Series 4: DWI · axial · 3.0mm · 0.73mm/px · z∈[-107,+54]mm · 5 of 55 slices shown (2 of 4)]
[im 1/55]
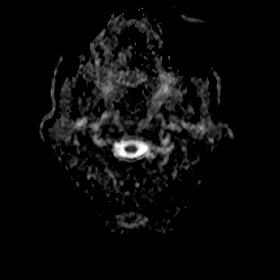
[im 14/55]
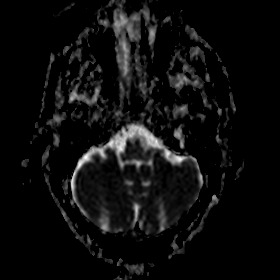
[im 28/55]
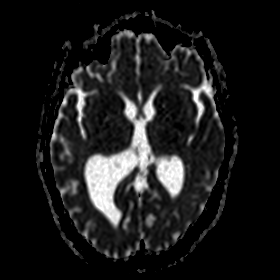
[im 41/55]
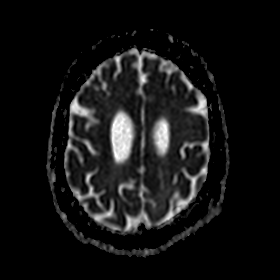
[im 55/55]
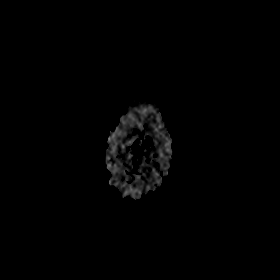

[Series 5: DWI · coronal · 5.0mm · 0.46mm/px · 3 of 34 slices shown (3 of 4)]
[im 1/34]
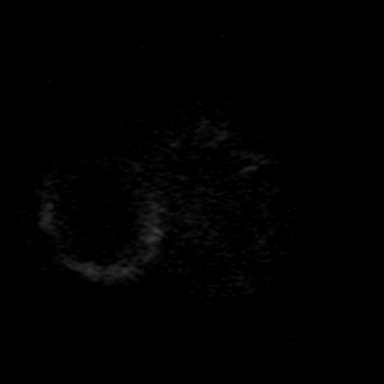
[im 17/34]
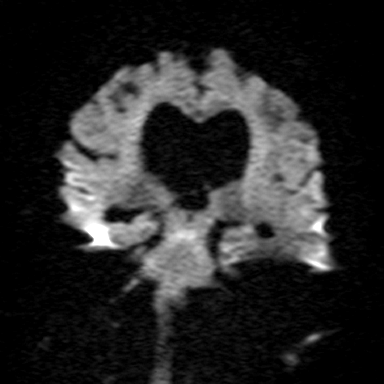
[im 34/34]
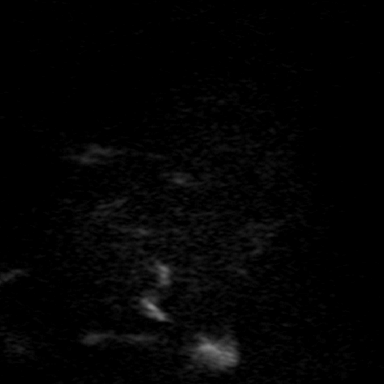

[Series 6: DWI · coronal · 5.0mm · 0.51mm/px · 3 of 34 slices shown (4 of 4)]
[im 1/34]
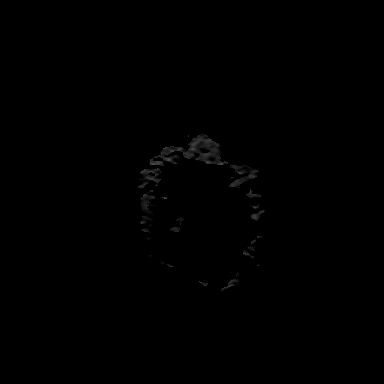
[im 17/34]
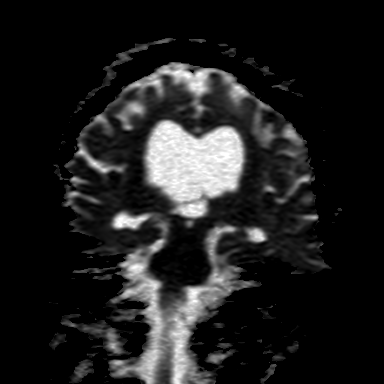
[im 34/34]
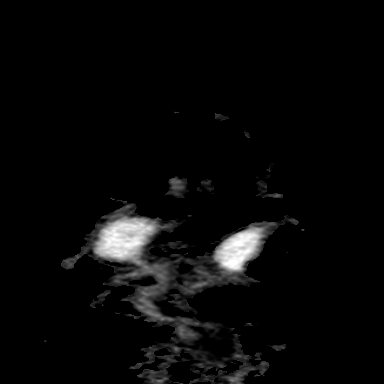

[Series 7: MRA · axial · 0.8mm · 0.32mm/px · 1 of 143 slices shown]
[im 1/143]
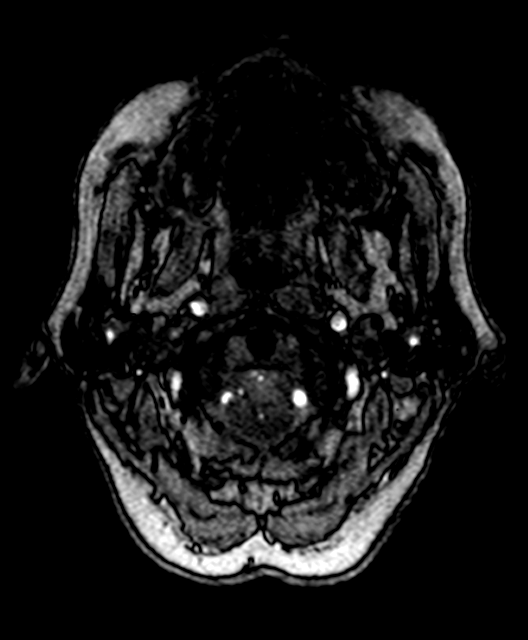

[Series 11: T2 · axial · 5.0mm · 0.64mm/px · z∈[-98,+44]mm · 2 of 23 slices shown (1 of 2)]
[im 1/23]
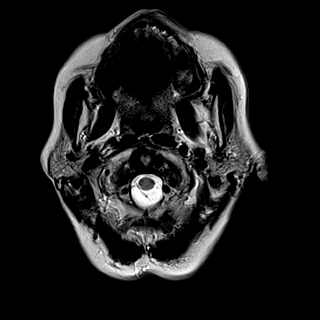
[im 23/23]
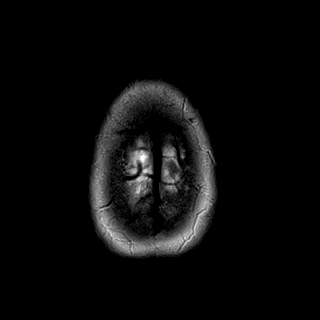

[Series 12: FLAIR · axial · 3.0mm · 0.81mm/px · z∈[-95,+42]mm · 4 of 47 slices shown]
[im 1/47]
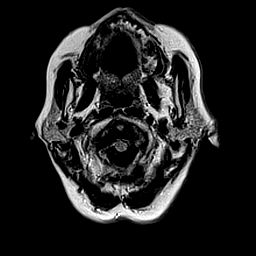
[im 16/47]
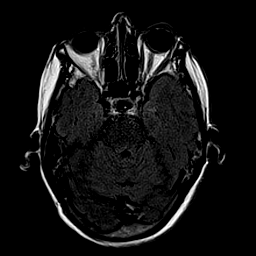
[im 31/47]
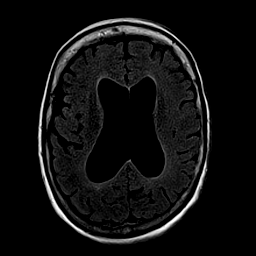
[im 47/47]
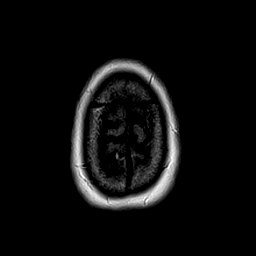

[Series 14: T1 · axial · 2.0mm · 0.41mm/px · z∈[-103,+52]mm · 7 of 79 slices shown]
[im 1/79]
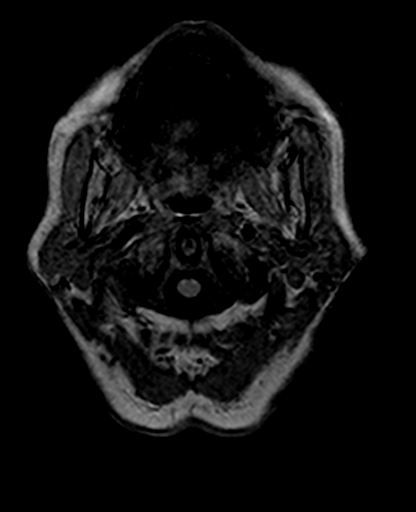
[im 14/79]
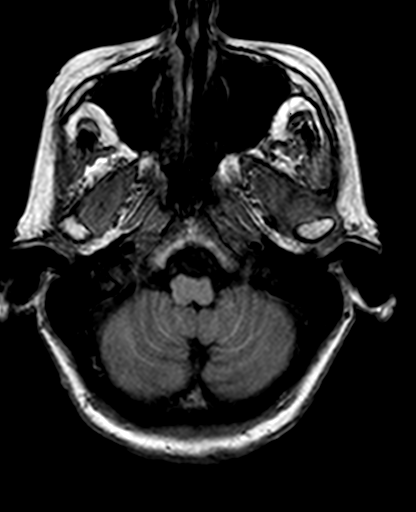
[im 27/79]
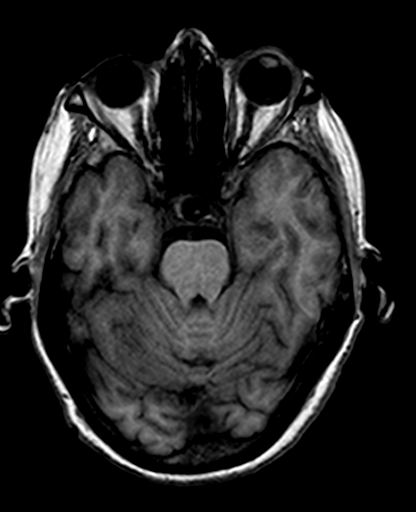
[im 40/79]
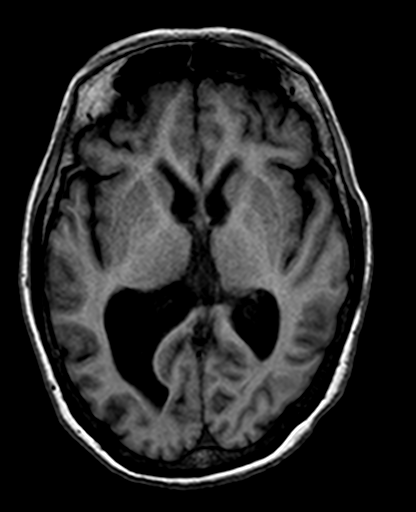
[im 53/79]
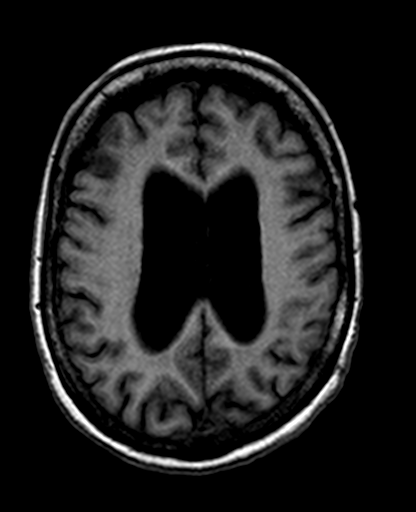
[im 66/79]
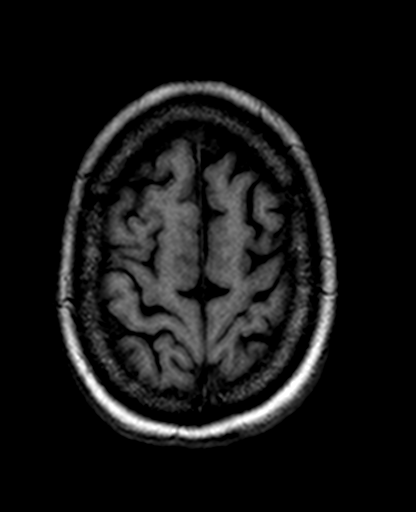
[im 79/79]
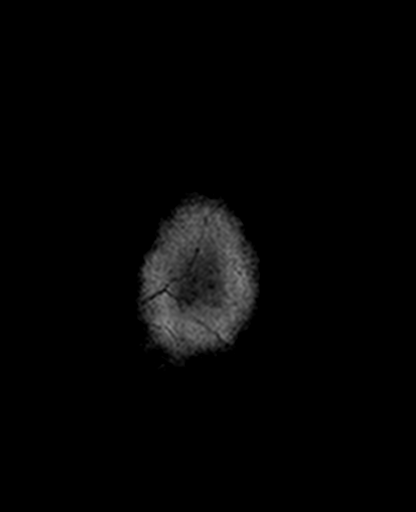

[Series 16: T2 · coronal · 5.0mm · 0.60mm/px · 2 of 28 slices shown (2 of 2)]
[im 1/28]
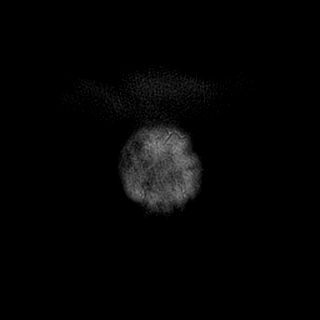
[im 28/28]
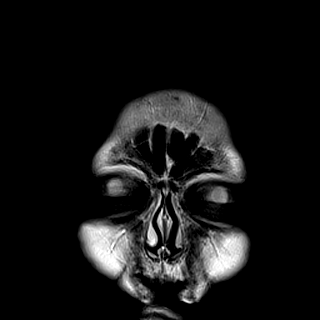

[34 of 48 positions shown; findings below may reference images not displayed]

FINDINGS: Antegrade flow in the posterior circulation with dominant distal
left vertebral artery. No distal vertebral artery stenosis. Patent
PICA origins. Patent vertebrobasilar junction. Normal AICA origins.
Patent basilar artery without stenosis. Patent SCA origins. Normal
right PCA origin. Fetal type left PCA origin. The right posterior
communicating artery is also present.

There is signal loss in the bilateral PCA P1/P2 segment which seems
to correspond to moderate stenoses on the post-contrast neck MRA
images today. Otherwise, allowing for mild artifact the bilateral
PCA branches are within normal limits.

Antegrade flow in both ICA siphons. No siphon stenosis. Ophthalmic
and posterior communicating artery origins are normal. Patent
carotid termini. Normal right ACA origin. There is mild to moderate
stenosis at the left ACA origin, probably mild this was not evident
on post-contrast neck MRA images today. The anterior communicating
artery and visible ACA branches are within normal limits.

Mild stenosis at the right MCA origin, also visible on the Neck MRA
images today. The right MCA M1 segment, right MCA bifurcation
visible right MCA branches are within normal limits. The left MCA
origins normal, but there is a severe stenosis of the left MCA M1
segment at or just proximal to the bifurcation, which is also
evident on the post-contrast neck MRA images today. Despite this,
the left MCA branches appear patent and fairly symmetric to those on
the right.
IMPRESSION: 1. Intracranial MRA is positive for significant intracranial
atherosclerosis and stenosis:
- Severe stenosis of the Left MCA distal M1 segment. The left MCA
branches remain patent.
- Moderate stenoses in the bilateral PCA P1/P2 segments.
- Mild stenoses at the Left ACA and Right MCA origins.
2. Brain MRI today is reported separately.

## 2019-06-02 DIAGNOSIS — M545 Low back pain: Secondary | ICD-10-CM | POA: Diagnosis not present

## 2019-06-02 DIAGNOSIS — R270 Ataxia, unspecified: Secondary | ICD-10-CM | POA: Diagnosis not present

## 2019-06-02 DIAGNOSIS — M5000 Cervical disc disorder with myelopathy, unspecified cervical region: Secondary | ICD-10-CM | POA: Diagnosis not present

## 2019-06-02 DIAGNOSIS — J449 Chronic obstructive pulmonary disease, unspecified: Secondary | ICD-10-CM | POA: Diagnosis not present

## 2019-07-25 DIAGNOSIS — M5000 Cervical disc disorder with myelopathy, unspecified cervical region: Secondary | ICD-10-CM

## 2019-07-25 DIAGNOSIS — M545 Low back pain, unspecified: Secondary | ICD-10-CM

## 2019-07-25 DIAGNOSIS — F321 Major depressive disorder, single episode, moderate: Secondary | ICD-10-CM

## 2019-07-25 DIAGNOSIS — K219 Gastro-esophageal reflux disease without esophagitis: Secondary | ICD-10-CM

## 2019-07-25 DIAGNOSIS — R32 Unspecified urinary incontinence: Secondary | ICD-10-CM

## 2019-07-25 DIAGNOSIS — F319 Bipolar disorder, unspecified: Secondary | ICD-10-CM

## 2019-07-25 DIAGNOSIS — R270 Ataxia, unspecified: Secondary | ICD-10-CM

## 2019-10-02 ENCOUNTER — Ambulatory Visit (INDEPENDENT_AMBULATORY_CARE_PROVIDER_SITE_OTHER): Payer: Medicaid Other | Admitting: Family Medicine

## 2019-10-02 ENCOUNTER — Emergency Department (HOSPITAL_COMMUNITY): Payer: Medicaid Other

## 2019-10-02 ENCOUNTER — Encounter: Payer: Self-pay | Admitting: Family Medicine

## 2019-10-02 ENCOUNTER — Encounter (HOSPITAL_COMMUNITY): Payer: Self-pay

## 2019-10-02 ENCOUNTER — Emergency Department (HOSPITAL_COMMUNITY)
Admission: EM | Admit: 2019-10-02 | Discharge: 2019-10-02 | Disposition: A | Discharge status: Home / Self Care | Payer: Medicaid Other | Attending: Emergency Medicine | Admitting: Emergency Medicine

## 2019-10-02 ENCOUNTER — Other Ambulatory Visit: Payer: Self-pay

## 2019-10-02 VITALS — BP 176/110 | HR 82 | Temp 97.9°F | Ht 63.0 in | Wt 188.2 lb

## 2019-10-02 DIAGNOSIS — Z79899 Other long term (current) drug therapy: Secondary | ICD-10-CM | POA: Diagnosis not present

## 2019-10-02 DIAGNOSIS — Z91040 Latex allergy status: Secondary | ICD-10-CM | POA: Diagnosis not present

## 2019-10-02 DIAGNOSIS — Z720 Tobacco use: Secondary | ICD-10-CM | POA: Diagnosis not present

## 2019-10-02 DIAGNOSIS — F17210 Nicotine dependence, cigarettes, uncomplicated: Secondary | ICD-10-CM | POA: Diagnosis not present

## 2019-10-02 DIAGNOSIS — J449 Chronic obstructive pulmonary disease, unspecified: Secondary | ICD-10-CM

## 2019-10-02 DIAGNOSIS — I69952 Hemiplegia and hemiparesis following unspecified cerebrovascular disease affecting left dominant side: Secondary | ICD-10-CM | POA: Diagnosis not present

## 2019-10-02 DIAGNOSIS — M545 Low back pain, unspecified: Secondary | ICD-10-CM

## 2019-10-02 DIAGNOSIS — I10 Essential (primary) hypertension: Secondary | ICD-10-CM

## 2019-10-02 DIAGNOSIS — M5000 Cervical disc disorder with myelopathy, unspecified cervical region: Secondary | ICD-10-CM

## 2019-10-02 DIAGNOSIS — R42 Dizziness and giddiness: Secondary | ICD-10-CM | POA: Insufficient documentation

## 2019-10-02 DIAGNOSIS — R260 Ataxic gait: Secondary | ICD-10-CM | POA: Insufficient documentation

## 2019-10-02 DIAGNOSIS — R32 Unspecified urinary incontinence: Secondary | ICD-10-CM

## 2019-10-02 DIAGNOSIS — R519 Headache, unspecified: Secondary | ICD-10-CM | POA: Diagnosis not present

## 2019-10-02 DIAGNOSIS — R4781 Slurred speech: Secondary | ICD-10-CM

## 2019-10-02 DIAGNOSIS — K219 Gastro-esophageal reflux disease without esophagitis: Secondary | ICD-10-CM

## 2019-10-02 LAB — BASIC METABOLIC PANEL
Anion gap: 10 (ref 5–15)
BUN: 14 mg/dL (ref 8–23)
CO2: 27 mmol/L (ref 22–32)
Calcium: 9.3 mg/dL (ref 8.9–10.3)
Chloride: 102 mmol/L (ref 98–111)
Creatinine, Ser: 0.85 mg/dL (ref 0.44–1.00)
GFR calc Af Amer: >60 mL/min (ref >60)
GFR calc non Af Amer: >60 mL/min (ref >60)
Glucose, Bld: 103 mg/dL — ABNORMAL HIGH (ref 70–99)
Potassium: 4 mmol/L (ref 3.5–5.1)
Sodium: 139 mmol/L (ref 135–145)

## 2019-10-02 LAB — CBC WITH DIFFERENTIAL/PLATELET
Abs Immature Granulocytes: 0.04 10*3/uL (ref 0.00–0.07)
Basophils Absolute: 0.1 10*3/uL (ref 0.0–0.1)
Basophils Relative: 1 %
Eosinophils Absolute: 0.1 10*3/uL (ref 0.0–0.5)
Eosinophils Relative: 1 %
HCT: 51.5 % — ABNORMAL HIGH (ref 36.0–46.0)
Hemoglobin: 16.9 g/dL — ABNORMAL HIGH (ref 12.0–15.0)
Immature Granulocytes: 0 %
Lymphocytes Relative: 26 %
Lymphs Abs: 2.8 10*3/uL (ref 0.7–4.0)
MCH: 32.3 pg (ref 26.0–34.0)
MCHC: 32.8 g/dL (ref 30.0–36.0)
MCV: 98.5 fL (ref 80.0–100.0)
Monocytes Absolute: 0.9 10*3/uL (ref 0.1–1.0)
Monocytes Relative: 8 %
Neutro Abs: 6.8 10*3/uL (ref 1.7–7.7)
Neutrophils Relative %: 64 %
Platelets: 342 10*3/uL (ref 150–400)
RBC: 5.23 MIL/uL — ABNORMAL HIGH (ref 3.87–5.11)
RDW: 15.3 % (ref 11.5–15.5)
WBC: 10.8 10*3/uL — ABNORMAL HIGH (ref 4.0–10.5)
nRBC: 0 % (ref 0.0–0.2)

## 2019-10-02 LAB — TROPONIN I (HIGH SENSITIVITY): Troponin I (High Sensitivity): 6 ng/L (ref <18)

## 2019-10-02 MED ORDER — AMLODIPINE BESYLATE 5 MG PO TABS
10.0000 mg | ORAL_TABLET | Freq: Once | ORAL | Status: AC
  Administered 2019-10-02: 10 mg via ORAL
  Filled 2019-10-02: qty 2

## 2019-10-02 MED ORDER — MECLIZINE HCL 12.5 MG PO TABS
25.0000 mg | ORAL_TABLET | Freq: Once | ORAL | Status: AC
  Administered 2019-10-02: 25 mg via ORAL
  Filled 2019-10-02: qty 2

## 2019-10-02 MED ORDER — AMLODIPINE BESYLATE 10 MG PO TABS: 10.0000 mg | ORAL_TABLET | Freq: Every day | ORAL | 0 refills | Status: AC

## 2019-10-02 MED ORDER — LORAZEPAM 1 MG PO TABS
1.0000 mg | ORAL_TABLET | Freq: Once | ORAL | Status: AC
  Administered 2019-10-02: 1 mg via ORAL
  Filled 2019-10-02: qty 1
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
## 2019-10-02 NOTE — ED Provider Notes (Signed)
   Patient signed out to me by Lindsey Jefferson, PA-C at end of shift.  Results of MRI pending   Patient is a 64 year old female with a past medical history of cerebral ventriculomegaly, ataxia, hypertension, and previous CVA with left arm weakness and history of poorly controlled hypertension.  She was sent here by her PCPs office for evaluation of intermittent dizziness and headache.  Work-up today is reassuring.  No evidence of end organ damage or new [place].  MRI brain is reassuring.  On recheck, patient reports feeling better she is no longer dizzy and she is requesting discharge home.  I feel that this is appropriate.  I will increase her amlodipine  to 10 mg daily.  She agrees to closely follow-up with Dr. Charlestine Massed office for further management of her hypertension   CT Head Wo Contrast  Result Date: 10/02/2019 CLINICAL DATA:  Headache, slurred speech. EXAM: CT HEAD WITHOUT CONTRAST TECHNIQUE: Contiguous axial images were obtained from the base of the skull through the vertex without intravenous contrast. COMPARISON:  October 05, 2017. FINDINGS: Brain: Stable mild ventriculomegaly is noted compared to prior exam. No mass effect or midline shift is noted. There is no evidence of hemorrhage, acute infarction or mass lesion. Vascular: No hyperdense vessel or unexpected calcification. Skull: Normal. Negative for fracture or focal lesion. Sinuses/Orbits: No acute finding. Other: None. IMPRESSION: Stable mild ventriculomegaly is noted compared to prior exam. No acute intracranial abnormality seen. Electronically Signed   By: Marijo Conception M.D.   On: 10/02/2019 13:28   MR BRAIN WO CONTRAST  Result Date: 10/02/2019 CLINICAL DATA:  Dizziness EXAM: MRI HEAD WITHOUT CONTRAST TECHNIQUE: Multiplanar, multiecho pulse sequences of the brain and surrounding structures were obtained without intravenous contrast. COMPARISON:  11/06/2017 FINDINGS: Motion artifact is present. Brain: There is no acute infarction or  intracranial hemorrhage. There is no intracranial mass, mass effect, or edema. There is no extra-axial fluid collection. Stable patchy T2 hyperintensity in the supratentorial white matter; nonspecific but may reflect stable mild chronic microvascular ischemic changes. Subtle T2 FLAIR hyperintensity is also again identified at the left greater than right superior/precentral gyri, which may reflect gliosis. Chronic ventriculomegaly. Vascular: Major vessel flow voids at the skull base are preserved. Skull and upper cervical spine: Normal marrow signal is preserved. Sinuses/Orbits: Paranasal sinuses are aerated. Orbits are unremarkable. Other: Sella is unremarkable.  Mastoid air cells are clear. IMPRESSION: No acute infarction, hemorrhage, or mass. Stable chronic findings detailed above. Electronically Signed   By: Macy Mis M.D.   On: 10/02/2019 17:37     Labs Reviewed  CBC WITH DIFFERENTIAL/PLATELET - Abnormal; Notable for the following components:      Result Value   WBC 10.8 (*)    RBC 5.23 (*)    Hemoglobin 16.9 (*)    HCT 51.5 (*)    All other components within normal limits  BASIC METABOLIC PANEL - Abnormal; Notable for the following components:   Glucose, Bld 103 (*)    All other components within normal limits  TROPONIN I (HIGH SENSITIVITY)      Kem Parkinson, PA-C 10/02/19 Precious Reel, MD 10/03/19 [phone]
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
## 2019-10-02 NOTE — ED Notes (Signed)
Patient was dizzy upon sitting and standing.

## 2019-10-02 NOTE — Discharge Instructions (Addendum)
Increase your amlodipine from 5 mg to 10 mg/day.  I have written a new prescription for you with your new dose.  Call Dr. Charlestine Massed office to arrange a follow-up appointment.  Return to the emergency department for any worsening symptoms.

## 2020-08-07 DEATH — deceased
